# Patient Record
Sex: Male | Born: 1980 | Hispanic: No | Marital: Married | Smoking: Never smoker
Health system: Southern US, Community
[De-identification: ages and names within clinical notes are randomized; demographics above are authoritative.]

## PROBLEM LIST (undated history)

## (undated) DIAGNOSIS — Z87442 Personal history of urinary calculi: Secondary | ICD-10-CM

## (undated) DIAGNOSIS — N201 Calculus of ureter: Secondary | ICD-10-CM

## (undated) DIAGNOSIS — Z87448 Personal history of other diseases of urinary system: Secondary | ICD-10-CM

## (undated) DIAGNOSIS — I10 Essential (primary) hypertension: Secondary | ICD-10-CM

## (undated) DIAGNOSIS — N261 Atrophy of kidney (terminal): Secondary | ICD-10-CM

## (undated) DIAGNOSIS — I4510 Unspecified right bundle-branch block: Secondary | ICD-10-CM

## (undated) DIAGNOSIS — N200 Calculus of kidney: Secondary | ICD-10-CM

---

## 2010-10-08 ENCOUNTER — Emergency Department (HOSPITAL_COMMUNITY)
Admission: EM | Admit: 2010-10-08 | Discharge: 2010-10-09 | Payer: Self-pay | Source: Home / Self Care | Admitting: Emergency Medicine

## 2010-10-10 ENCOUNTER — Emergency Department (HOSPITAL_COMMUNITY)
Admission: EM | Admit: 2010-10-10 | Discharge: 2010-10-11 | Payer: Self-pay | Source: Home / Self Care | Admitting: Emergency Medicine

## 2010-10-17 ENCOUNTER — Ambulatory Visit (HOSPITAL_COMMUNITY)
Admission: RE | Admit: 2010-10-17 | Discharge: 2010-10-17 | Payer: Self-pay | Source: Home / Self Care | Attending: Urology | Admitting: Urology

## 2010-10-17 HISTORY — PX: OTHER SURGICAL HISTORY: SHX169

## 2010-10-17 LAB — BASIC METABOLIC PANEL
BUN: 11 mg/dL (ref 6–23)
CO2: 24 mEq/L (ref 19–32)
Calcium: 9 mg/dL (ref 8.4–10.5)
Chloride: 104 mEq/L (ref 96–112)
Creatinine, Ser: 1.15 mg/dL (ref 0.4–1.5)
GFR calc Af Amer: >60 mL/min (ref >60)
GFR calc non Af Amer: >60 mL/min (ref >60)
Glucose, Bld: 96 mg/dL (ref 70–99)
Potassium: 2.9 mEq/L — ABNORMAL LOW (ref 3.5–5.1)
Sodium: 137 mEq/L (ref 135–145)

## 2010-10-17 LAB — DIFFERENTIAL
Basophils Absolute: 0.1 10*3/uL (ref 0.0–0.1)
Basophils Relative: 1 % (ref 0–1)
Eosinophils Absolute: 0.5 10*3/uL (ref 0.0–0.7)
Eosinophils Relative: 4 % (ref 0–5)
Lymphocytes Relative: 28 % (ref 12–46)
Lymphs Abs: 3.3 10*3/uL (ref 0.7–4.0)
Monocytes Absolute: 0.8 10*3/uL (ref 0.1–1.0)
Monocytes Relative: 7 % (ref 3–12)
Neutro Abs: 7.2 10*3/uL (ref 1.7–7.7)
Neutrophils Relative %: 61 % (ref 43–77)

## 2010-10-17 LAB — URINALYSIS, ROUTINE W REFLEX MICROSCOPIC
Bilirubin Urine: NEGATIVE
Bilirubin Urine: NEGATIVE
Ketones, ur: NEGATIVE mg/dL
Ketones, ur: NEGATIVE mg/dL
Nitrite: NEGATIVE
Nitrite: NEGATIVE
Protein, ur: NEGATIVE mg/dL
Protein, ur: NEGATIVE mg/dL
Specific Gravity, Urine: 1.019 (ref 1.005–1.030)
Specific Gravity, Urine: 1.015 (ref 1.005–1.030)
Urine Glucose, Fasting: NEGATIVE mg/dL
Urine Glucose, Fasting: NEGATIVE mg/dL
Urobilinogen, UA: 0.2 mg/dL (ref 0.0–1.0)
Urobilinogen, UA: 0.2 mg/dL (ref 0.0–1.0)
pH: 6 (ref 5.0–8.0)
pH: 5.5 (ref 5.0–8.0)

## 2010-10-17 LAB — POCT I-STAT, CHEM 8
BUN: 22 mg/dL (ref 6–23)
Calcium, Ion: 1.04 mmol/L — ABNORMAL LOW (ref 1.12–1.32)
Chloride: 103 mEq/L (ref 96–112)
Creatinine, Ser: 1.4 mg/dL (ref 0.4–1.5)
Glucose, Bld: 108 mg/dL — ABNORMAL HIGH (ref 70–99)
HCT: 47 % (ref 39.0–52.0)
Hemoglobin: 16 g/dL (ref 13.0–17.0)
Potassium: 2.7 mEq/L — CL (ref 3.5–5.1)
Sodium: 138 mEq/L (ref 135–145)
TCO2: 28 mmol/L (ref 0–100)

## 2010-10-17 LAB — CBC
HCT: 43.8 % (ref 39.0–52.0)
Hemoglobin: 15.7 g/dL (ref 13.0–17.0)
MCH: 25.2 pg — ABNORMAL LOW (ref 26.0–34.0)
MCHC: 35.8 g/dL (ref 30.0–36.0)
MCV: 70.3 fL — ABNORMAL LOW (ref 78.0–100.0)
Platelets: 273 10*3/uL (ref 150–400)
RBC: 6.23 MIL/uL — ABNORMAL HIGH (ref 4.22–5.81)
RDW: 12.8 % (ref 11.5–15.5)
WBC: 11.9 10*3/uL — ABNORMAL HIGH (ref 4.0–10.5)

## 2010-10-17 LAB — URINE CULTURE
Colony Count: NO GROWTH
Colony Count: NO GROWTH
Culture  Setup Time: 201201081127
Culture  Setup Time: 201201092253
Culture: NO GROWTH
Culture: NO GROWTH

## 2010-10-17 LAB — URINE MICROSCOPIC-ADD ON

## 2010-10-19 LAB — BASIC METABOLIC PANEL
BUN: 15 mg/dL (ref 6–23)
CO2: 30 mEq/L (ref 19–32)
Calcium: 9.6 mg/dL (ref 8.4–10.5)
Chloride: 101 mEq/L (ref 96–112)
Creatinine, Ser: 1.41 mg/dL (ref 0.4–1.5)
GFR calc Af Amer: >60 mL/min (ref >60)
GFR calc non Af Amer: 58 mL/min — ABNORMAL LOW (ref >60)
Glucose, Bld: 97 mg/dL (ref 70–99)
Potassium: 3.9 mEq/L (ref 3.5–5.1)
Sodium: 138 mEq/L (ref 135–145)

## 2010-10-19 LAB — SURGICAL PCR SCREEN
MRSA, PCR: NEGATIVE
Staphylococcus aureus: NEGATIVE

## 2010-10-24 ENCOUNTER — Ambulatory Visit (HOSPITAL_COMMUNITY)
Admission: RE | Admit: 2010-10-24 | Discharge: 2010-10-24 | Payer: Self-pay | Source: Home / Self Care | Attending: Urology | Admitting: Urology

## 2010-10-24 NOTE — Op Note (Signed)
NAMESUHEYB, RAUCCI NO.:  [id number]  MEDICAL RECORD NO.:  [id number]          PATIENT TYPE:  AMB  LOCATION:  DAY                          FACILITY:  [facility]  PHYSICIAN:  Valetta Fuller, M.D.  DATE OF BIRTH:  1981/06/25  DATE OF PROCEDURE: DATE OF DISCHARGE:  10/17/2010                              OPERATIVE REPORT   PREOPERATIVE DIAGNOSES: 1. Left-sided flank and abdominal discomfort. 2. A 2.2-cm obstructing left ureteropelvic junction calculus. 3. Severe hydronephrosis with parenchymal thinning.  POSTOPERATIVE DIAGNOSES: 1. Left-sided flank and abdominal discomfort. 2. A 2.2-cm obstructing left ureteropelvic junction calculus. 3. Severe hydronephrosis with parenchymal thinning.  PROCEDURE PERFORMED:  Cystoscopy with left retrograde pyelogram and fluoroscopic interpretation and left double-J stent placement.  SURGEON:  Valetta Fuller, M.D.  ANESTHESIA:  General.  INDICATIONS:  Mr. Hillery is a 30 year old Asian male.  He does not speak [place].  He was in our office recently after a visit to the emergency room at [facility] with some nonspecific left-sided abdominal discomfort.  A CT scan at that time showed a nonobstructing 8-mm stone in the lower pole of his right kidney and a 2.2-cm stone in his left ureteropelvic junction.  His overall systemic renal function was normal. I carefully reviewed his images at the time of his presentation and the patient was felt to have marked left-sided hydronephrosis with fairly severe parenchymal thinning/parenchymal atrophy.  It was our feeling that this stone had certainly been obstructing on a chronic basis.  We did not have a good explanation for why his pain had suddenly intensified given what appeared to be very longstanding chronic obstruction.  We did feel that given his ongoing pain that it would be prudent to place a double-J stent to see if his pain resolved.  We then felt that his kidney function  would need to be assessed with a renal scan and if that kidney were nonfunctioning, recommend nephrectomy as the most likely treatment option.  If the stone did have salvageable function, then he would need likely an open or percutaneous approach to deal with this stone, especially given its size and density.  Through an interpreter we explained this to the family and the patient.  The patient presents now for stent placement and assessment.  TECHNIQUE AND FINDINGS:  The patient was brought to the operating room where he had successful induction of general anesthesia.  He received perioperative ciprofloxacin.  He was placed in the lithotomy position and prepped and draped in the usual manner.  Appropriate surgical time- out was performed.  Cystoscopy revealed an unremarkable anterior urethra, prostatic urethra and bladder.  Retrograde pyelogram was done with an open-ended catheter.  Fluoroscopic interpretation was performed under real time.  The patient had a very large filling defect at his ureteropelvic junction.  There was marked dilation of the renal pelvis and caliceal system.  The obstruction appeared to be high-grade.  Guidewire was placed beyond the stone with some difficulty.  We then placed a 6-French 24 cm stent which was confirmed to be in good position with fluoroscopic as well  as visual guidance.  The patient did not appear to have any complications and was brought to the recovery room in stable condition.     Valetta Fuller, M.D.     DSG/MEDQ  D:  10/18/2010  T:  10/18/2010  Job:  [id number]  Electronically Signed by Barron Alvine M.D. on 10/24/2010 09:22:27 AM

## 2010-10-27 ENCOUNTER — Other Ambulatory Visit: Payer: Self-pay | Admitting: Urology

## 2010-10-27 DIAGNOSIS — N200 Calculus of kidney: Secondary | ICD-10-CM

## 2010-11-16 ENCOUNTER — Other Ambulatory Visit: Payer: Self-pay | Admitting: Urology

## 2010-11-16 ENCOUNTER — Encounter (HOSPITAL_COMMUNITY): Payer: No Typology Code available for payment source | Attending: Urology

## 2010-11-16 DIAGNOSIS — Z01812 Encounter for preprocedural laboratory examination: Secondary | ICD-10-CM | POA: Insufficient documentation

## 2010-11-16 LAB — CBC
HCT: 45.8 % (ref 39.0–52.0)
Hemoglobin: 16.3 g/dL (ref 13.0–17.0)
MCH: 25 pg — ABNORMAL LOW (ref 26.0–34.0)
MCHC: 35.6 g/dL (ref 30.0–36.0)
MCV: 70.1 fL — ABNORMAL LOW (ref 78.0–100.0)
Platelets: 292 10*3/uL (ref 150–400)
RBC: 6.53 MIL/uL — ABNORMAL HIGH (ref 4.22–5.81)
RDW: 12.7 % (ref 11.5–15.5)
WBC: 8 10*3/uL (ref 4.0–10.5)

## 2010-11-16 LAB — SURGICAL PCR SCREEN
MRSA, PCR: NEGATIVE
Staphylococcus aureus: NEGATIVE

## 2010-11-21 ENCOUNTER — Other Ambulatory Visit: Payer: Self-pay | Admitting: Urology

## 2010-11-21 ENCOUNTER — Ambulatory Visit (HOSPITAL_COMMUNITY)
Admission: RE | Admit: 2010-11-21 | Discharge: 2010-11-21 | Disposition: A | Discharge status: Home / Self Care | Payer: No Typology Code available for payment source | Source: Ambulatory Visit | Attending: Urology | Admitting: Urology

## 2010-11-21 ENCOUNTER — Other Ambulatory Visit (HOSPITAL_COMMUNITY): Payer: Self-pay

## 2010-11-21 ENCOUNTER — Ambulatory Visit (HOSPITAL_COMMUNITY): Payer: No Typology Code available for payment source

## 2010-11-21 ENCOUNTER — Inpatient Hospital Stay (HOSPITAL_COMMUNITY)
Admission: RE | Admit: 2010-11-21 | Discharge: 2010-11-24 | DRG: 660 | Disposition: A | Discharge status: Home / Self Care | Payer: No Typology Code available for payment source | Attending: Urology | Admitting: Urology

## 2010-11-21 DIAGNOSIS — N269 Renal sclerosis, unspecified: Secondary | ICD-10-CM | POA: Diagnosis present

## 2010-11-21 DIAGNOSIS — N200 Calculus of kidney: Secondary | ICD-10-CM

## 2010-11-21 DIAGNOSIS — N1330 Unspecified hydronephrosis: Secondary | ICD-10-CM | POA: Diagnosis present

## 2010-11-21 DIAGNOSIS — Z79899 Other long term (current) drug therapy: Secondary | ICD-10-CM

## 2010-11-21 DIAGNOSIS — N201 Calculus of ureter: Principal | ICD-10-CM | POA: Diagnosis present

## 2010-11-21 HISTORY — PX: NEPHROLITHOTOMY: SUR881

## 2010-11-21 MED ORDER — IOHEXOL 300 MG/ML  SOLN: 15.0000 mL | Freq: Once | INTRAMUSCULAR | Status: AC | PRN

## 2010-11-22 ENCOUNTER — Ambulatory Visit (HOSPITAL_COMMUNITY): Payer: No Typology Code available for payment source

## 2010-11-22 LAB — CBC
HCT: 41.4 % (ref 39.0–52.0)
Hemoglobin: 14.6 g/dL (ref 13.0–17.0)
MCH: 25.1 pg — ABNORMAL LOW (ref 26.0–34.0)
MCHC: 35.3 g/dL (ref 30.0–36.0)
MCV: 71.1 fL — ABNORMAL LOW (ref 78.0–100.0)
Platelets: 285 10*3/uL (ref 150–400)
RBC: 5.82 MIL/uL — ABNORMAL HIGH (ref 4.22–5.81)
RDW: 12.8 % (ref 11.5–15.5)
WBC: 14.5 10*3/uL — ABNORMAL HIGH (ref 4.0–10.5)

## 2010-11-22 LAB — BASIC METABOLIC PANEL
CO2: 25 mEq/L (ref 19–32)
Calcium: 9 mg/dL (ref 8.4–10.5)
Chloride: 104 mEq/L (ref 96–112)
GFR calc Af Amer: >60 mL/min (ref >60)
Glucose, Bld: 132 mg/dL — ABNORMAL HIGH (ref 70–99)
Sodium: 136 mEq/L (ref 135–145)

## 2010-12-09 NOTE — Op Note (Signed)
Jeremiah, [name] NO.:  [id number]  MEDICAL RECORD NO.:  [id number]           PATIENT TYPE:  O  LOCATION:  1418                         FACILITY:  [facility]  PHYSICIAN:  Valetta Fuller, M.D.  DATE OF BIRTH:  12/07/1980  DATE OF PROCEDURE:  11/21/2010 DATE OF DISCHARGE:                              OPERATIVE REPORT   PREOPERATIVE DIAGNOSES: 1. A 3-cm left ureteropelvic junction stone. 2. Severe hydronephrosis. 3. Renal parenchymal atrophy.  POSTOPERATIVE DIAGNOSIS: 1. A 3-cm left ureteropelvic junction stone. 2. Severe hydronephrosis. 3. Renal parenchymal atrophy.  PROCEDURE PERFORMED:  Percutaneous nephrolithotomy.  SURGEON:  Valetta Fuller, M.D.  ASSISTANT:  Arn Medal, MD from Interventional Radiology.  ANESTHESIA:  General endotracheal.  INDICATIONS:  Jeremiah [name] is 30 years of age.  He had presented to see [place] because of significant left flank pain.  A CT scan performed in the emergency room revealed approximately a 3-cm stone at his ureteropelvic junction with very high-grade obstruction and considerable parenchymal atrophy suggesting that this had been a longstanding process.  His renal scan, however, did show a nonfunctioning left kidney to justify preservation of that kidney.  A small double-J stent was placed prior to his renal scan.  We discussed the situation with the patient by an interpreter along with family members.  We felt that given his young age and health that salvage of that kidney was important.  Given the size of the stone and its density, the only reasonable option was percutaneous nephrolithotomy.  The patient had placement of nephrostomy tube without difficulty this morning and Interventional Radiology and presents now for the procedure.  He has received perioperative antibiotics and placement of compression boots.  TECHNIQUE AND FINDINGS:  The patient was brought to the operating room. He had successful induction of general  endotracheal anesthesia.  He was then placed in the prone position.  All extremities carefully padded. He was prepped and draped in the usual manner.  Interventional Radiology then placed a wire through the nephrostomy tube.  A small stab incision was made at the site of the nephrostomy tube and a peel-away sheath was placed to allow placement of a second safety wire.  The fascia was then dilated and access sheath placed.  The stone was easily encountered, impacted in the ureteropelvic junction with good visualization and excellent nephrostomy tube placement. Initial attempts to fracture the stone with a lithoclast were unsuccessful and it was quite obvious that this was a very dense stone, probably consisting primarily of calcium oxalate monohydrate.  For that reason, we then utilized a holmium laser lithotriptor to fracture the stone.  This was a long arduous processed taking approximately an hour and half of laser time.  Large fragments were extracted with grasper and on completion of the procedure there were no obvious residual fragments.A new stent was placed over one of the safety wires down to the bladder and a red Robinson catheter was placed as a nephrostomy tube.  There was no evidence of any gross extravasation on nephrostogram and no evidence of any renal collecting system perforation.  The patient remained stable, had no obvious complications and was brought to recovery room in stable condition.     Valetta Fuller, M.D.     DSG/MEDQ  D:  11/22/2010  T:  11/22/2010  Job:  [id number]  Electronically Signed by Barron Alvine M.D. on 12/09/2010 01:46:28 PM

## 2010-12-20 ENCOUNTER — Other Ambulatory Visit (HOSPITAL_COMMUNITY): Payer: Self-pay | Admitting: Radiology

## 2012-08-02 ENCOUNTER — Encounter (HOSPITAL_COMMUNITY): Payer: Self-pay | Admitting: Emergency Medicine

## 2012-08-02 ENCOUNTER — Emergency Department (HOSPITAL_COMMUNITY)
Admission: EM | Admit: 2012-08-02 | Discharge: 2012-08-02 | Disposition: A | Discharge status: Home / Self Care | Payer: No Typology Code available for payment source | Source: Home / Self Care | Attending: Emergency Medicine | Admitting: Emergency Medicine

## 2012-08-02 DIAGNOSIS — R030 Elevated blood-pressure reading, without diagnosis of hypertension: Secondary | ICD-10-CM

## 2012-08-02 DIAGNOSIS — R51 Headache: Secondary | ICD-10-CM

## 2012-08-02 MED ORDER — HYDROCHLOROTHIAZIDE 25 MG PO TABS: 12.5000 mg | ORAL_TABLET | Freq: Every day | ORAL | Status: DC

## 2012-08-02 NOTE — ED Provider Notes (Signed)
History     CSN: [id number]  Arrival date & time 08/02/12  1656   First MD Initiated Contact with Patient 08/02/12 1658      Chief Complaint  Patient presents with  . Hypertension  . Headache    (Consider location/radiation/quality/duration/timing/severity/associated sxs/prior treatment) HPI Comments: Patient presents urgent care this evening complaining of an ongoing headache that comes and goes for several months. He describes that he suspects his blood pressure as high as he has taken his blood pressure at other locations thinks that at a pharmacy where his readings are always elevated. He denies any shortness of breath, lower extremity swelling or shortness of breath. At this point patient denies any severe headache, numbness tingling sensation or weakness of his upper or lower extremities.  Patient is a 31 y.o. male presenting with hypertension and headaches. The history is provided by the patient.  Hypertension The problem occurs every several days. The problem has not changed since onset.Associated symptoms include headaches. Pertinent negatives include no chest pain, no abdominal pain and no shortness of breath. Nothing relieves the symptoms.  Headache The primary symptoms include headaches. Primary symptoms do not include seizures, dizziness or fever.  The headache is not associated with neck stiffness or weakness.  Additional symptoms do not include neck stiffness or weakness. Medical issues also include hypertension.    History reviewed. No pertinent past medical history.  No past surgical history on file.  No family history on file.  History  Substance Use Topics  . Smoking status: Not on file  . Smokeless tobacco: Not on file  . Alcohol Use: Not on file      Review of Systems  Constitutional: Negative for fever, chills, activity change, appetite change and fatigue.  HENT: Negative for neck stiffness.   Eyes: Negative for visual disturbance.  Respiratory:  Negative for cough and shortness of breath.   Cardiovascular: Negative for chest pain.  Gastrointestinal: Negative for abdominal pain.  Skin: Negative for rash and wound.  Neurological: Positive for headaches. Negative for dizziness, tremors, seizures, syncope, facial asymmetry, weakness and numbness.    Allergies  Review of patient's allergies indicates no known allergies.  Home Medications   Current Outpatient Rx  Name Route Sig Dispense Refill  . HYDROCHLOROTHIAZIDE 25 MG PO TABS Oral Take 0.5 tablets (12.5 mg total) by mouth daily. 30 tablet 1    BP 165/98  Pulse 76  Temp 98.4 F (36.9 C) (Oral)  Resp 16  Physical Exam  Nursing note and vitals reviewed. Constitutional: He is oriented to person, place, and time. Vital signs are normal. He appears well-developed and well-nourished.  Non-toxic appearance. He does not have a sickly appearance. He does not appear ill. No distress.  HENT:  Head: Normocephalic.  Neck: No JVD present.  Cardiovascular: Normal rate, regular rhythm, normal heart sounds and normal pulses.  Exam reveals no gallop and no friction rub.   No murmur heard. Pulmonary/Chest: Effort normal and breath sounds normal. He has no decreased breath sounds. He has no wheezes. He has no rhonchi. He has no rales.  Abdominal: Soft.  Musculoskeletal: Normal range of motion.  Neurological: He is alert and oriented to person, place, and time. No cranial nerve deficit. He exhibits normal muscle tone. Coordination normal.  Skin: Skin is warm.    ED Course  Procedures (including critical care time)  Labs Reviewed - No data to display No results found.   1. Elevated blood pressure   2. Headache  MDM  Patient is reporting previous elevated blood pressures. Have decided to start patient hydrochlorothiazide a minimal does have encouraged him to followup with her primary care doctor in order for him to 4 weeks. Patient understands        Jimmie Molly,  MD 08/02/12 1859

## 2012-08-02 NOTE — ED Notes (Signed)
Reports headache for months now.  Patient reports hight blood pressure for months now.  Reports that he doesn't have primary provider

## 2012-11-18 ENCOUNTER — Emergency Department (INDEPENDENT_AMBULATORY_CARE_PROVIDER_SITE_OTHER): Payer: No Typology Code available for payment source

## 2012-11-18 ENCOUNTER — Emergency Department (INDEPENDENT_AMBULATORY_CARE_PROVIDER_SITE_OTHER)
Admission: EM | Admit: 2012-11-18 | Discharge: 2012-11-18 | Disposition: A | Discharge status: Home / Self Care | Payer: No Typology Code available for payment source | Source: Home / Self Care | Attending: Emergency Medicine | Admitting: Emergency Medicine

## 2012-11-18 ENCOUNTER — Encounter (HOSPITAL_COMMUNITY): Payer: Self-pay | Admitting: Emergency Medicine

## 2012-11-18 DIAGNOSIS — N200 Calculus of kidney: Secondary | ICD-10-CM

## 2012-11-18 HISTORY — DX: Essential (primary) hypertension: I10

## 2012-11-18 LAB — POCT URINALYSIS DIP (DEVICE)
Bilirubin Urine: NEGATIVE
Ketones, ur: NEGATIVE mg/dL
Leukocytes, UA: NEGATIVE
Protein, ur: NEGATIVE mg/dL
pH: 5.5 (ref 5.0–8.0)

## 2012-11-18 MED ORDER — ONDANSETRON HCL 4 MG/2ML IJ SOLN
4.0000 mg | Freq: Once | INTRAMUSCULAR | Status: AC
  Administered 2012-11-18: 4 mg via INTRAVENOUS

## 2012-11-18 MED ORDER — HYDROMORPHONE HCL PF 1 MG/ML IJ SOLN
2.0000 mg | Freq: Once | INTRAMUSCULAR | Status: AC
  Administered 2012-11-18: 2 mg via INTRAVENOUS

## 2012-11-18 MED ORDER — SODIUM CHLORIDE 0.9 % IV SOLN
INTRAVENOUS | Status: DC
  Administered 2012-11-18: 14:00:00 via INTRAVENOUS

## 2012-11-18 MED ORDER — ONDANSETRON 8 MG PO TBDP: 8.0000 mg | ORAL_TABLET | Freq: Three times a day (TID) | ORAL | Status: DC | PRN

## 2012-11-18 MED ORDER — HYDROCHLOROTHIAZIDE 25 MG PO TABS: 25.0000 mg | ORAL_TABLET | Freq: Every day | ORAL | Status: DC

## 2012-11-18 MED ORDER — HYDROMORPHONE HCL PF 1 MG/ML IJ SOLN
INTRAMUSCULAR | Status: AC
  Filled 2012-11-18: qty 2

## 2012-11-18 MED ORDER — TAMSULOSIN HCL 0.4 MG PO CAPS: 0.4000 mg | ORAL_CAPSULE | Freq: Every day | ORAL | Status: DC

## 2012-11-18 MED ORDER — ONDANSETRON HCL 4 MG/2ML IJ SOLN
INTRAMUSCULAR | Status: AC
  Filled 2012-11-18: qty 2

## 2012-11-18 MED ORDER — OXYCODONE-ACETAMINOPHEN 5-325 MG PO TABS
ORAL_TABLET | ORAL | Status: DC
Start: 2012-11-18 — End: 2014-03-13

## 2012-11-18 MED ORDER — KETOROLAC TROMETHAMINE 30 MG/ML IJ SOLN
INTRAMUSCULAR | Status: AC
  Filled 2012-11-18: qty 1

## 2012-11-18 MED ORDER — KETOROLAC TROMETHAMINE 30 MG/ML IJ SOLN
30.0000 mg | Freq: Once | INTRAMUSCULAR | Status: AC
  Administered 2012-11-18: 30 mg via INTRAVENOUS

## 2012-11-18 MED ORDER — HYDROMORPHONE HCL PF 1 MG/ML IJ SOLN: 2.0000 mg | Freq: Once | INTRAMUSCULAR | Status: DC

## 2012-11-18 NOTE — ED Notes (Signed)
Pt  Reports  Pain is  Better   Family  At  Bedside

## 2012-11-18 NOTE — ED Provider Notes (Signed)
Chief Complaint  Patient presents with  . Flank Pain    History of Present Illness:   Jeremiah [name] is a 32 year old male who has had a 3 to four-week history of right flank pain has gotten worse in the past 24 hours. He rates the pain a 10 over 10 in intensity. He denies any fever, chills, nausea, or vomiting. He's had no urinary symptoms including dysuria, frequency, urgency, or hematuria. He denies any constipation, diarrhea, blood in stool. He had a kidney stone several years ago and was seen by [facility] for that.  Review of Systems:  Other than noted above, the patient denies any of the following symptoms: General:  No fevers, chills, sweats, aches, or fatigue. GI:  No abdominal pain, back pain, nausea, vomiting, diarrhea, or constipation. GU:  No dysuria, frequency, urgency, hematuria, urethral discharge, penile lesions, penile pain, testicular pain, swelling, or mass, inguinal lymphadenopathy or incontinence.  PMFSH:  Past medical history, family history, social history, meds, and allergies were reviewed.  Physical Exam:   Vital signs:  BP 150/82  Pulse 72  Temp(Src) 98.6 F (37 C) (Oral)  SpO2 100% Gen:  Alert, oriented, in moderate distress due to pain from kidney stone. Lungs:  Clear to auscultation, no wheezes, rales or rhonchi. Heart:  Regular rhythm, no gallop or murmer. Abdomen:  Flat and soft.  No tenderness to palpation, guarding, or rebound.  No hepato-splenomegaly or mass.  Bowel sounds were normally active.  No hernia. Back:  No CVA tenderness.  Skin:  Clear, warm and dry.  Labs:   Results for orders placed during the hospital encounter of 11/18/12  POCT URINALYSIS DIP (DEVICE)      Result Value Range   Glucose, UA NEGATIVE  NEGATIVE mg/dL   Bilirubin Urine NEGATIVE  NEGATIVE   Ketones, ur NEGATIVE  NEGATIVE mg/dL   Specific Gravity, Urine <=1.005  1.005 - 1.030   Hgb urine dipstick SMALL (*) NEGATIVE   pH 5.5  5.0 - 8.0   Protein, ur NEGATIVE  NEGATIVE mg/dL    Urobilinogen, UA 0.2  0.0 - 1.0 mg/dL   Nitrite NEGATIVE  NEGATIVE   Leukocytes, UA NEGATIVE  NEGATIVE   Dg Abd 1 View  11/18/2012  *RADIOLOGY REPORT*  Clinical Data: Right-sided flank pain for past 2 weeks.  ABDOMEN - 1 VIEW  Comparison: Abdominal radiograph 02/28/2011.  Findings: Again noted is a large 9 mm calcification projecting over the lower pole collecting system of the right kidney.  A rim calcified structure projects over the expected location of the lower pole collecting system of the left kidney, however it is uncertain whether not this represents a stone, or something in the fecal stream.  No additional calcifications are confidently identified along the course of either ureter.  A small left sided pelvic phlebolith is incidentally noted. Visualized bowel gas pattern is nonobstructive.  IMPRESSION: 1.  9 mm calculus in the lower pole collecting system of the right kidney. 2.  6 mm rim calcified structure projecting over the lower pole of the left kidney may represent a new nonobstructive calculus, or may be within the fecal stream.   Original Report Authenticated By: Trudie Reed, M.D.    Course in Urgent Care Center:   He was given IV normal saline, 1 L, Toradol 30 mg IV, Dilaudid 2 mg IV, and Zofran 4 mg IV. He was a little bit drowsy after these meds, his pain was somewhat better, down to 7/10 but not gone away completely.  Assessment: The encounter diagnosis was Kidney stone.   Plan:   1.  The following meds were prescribed:   Discharge Medication List as of 11/18/2012  3:37 PM    START taking these medications   Details  !! hydrochlorothiazide (HYDRODIURIL) 25 MG tablet Take 1 tablet (25 mg total) by mouth daily., Starting 11/18/2012, Until Discontinued, Normal    ondansetron (ZOFRAN ODT) 8 MG disintegrating tablet Take 1 tablet (8 mg total) by mouth every 8 (eight) hours as needed for nausea., Starting 11/18/2012, Until Discontinued, Normal    oxyCODONE-acetaminophen  (PERCOCET) 5-325 MG per tablet 1 to 2 tablets every 6 hours as needed for pain., Print    Tamsulosin HCl (FLOMAX) 0.4 MG CAPS Take 1 capsule (0.4 mg total) by mouth daily., Starting 11/18/2012, Until Discontinued, Normal     !! - Potential duplicate medications found. Please discuss with provider.     2.  The patient was instructed in symptomatic care and handouts were given. 3.  The patient was told to return if becoming worse in any way, if no better in 3 or 4 days, and given some red flag symptoms that would indicate earlier return.  Follow up:  The patient was told to follow up with [facility] tomorrow at 2:45 PM. She was given a refill for his hydrochlorothiazide which he had been taking but got off of. I told he would need to followup with a primary care physician for his high blood pressure.      Reuben Likes, MD 11/18/12 2052

## 2012-11-18 NOTE — ED Notes (Signed)
Pt  Reports    r  Flank  Pain           With  Nausea     No   Vomiting          Symptoms  Started   About  2  Weeks     Ago           Got  Worse    Today

## 2012-11-18 NOTE — ED Notes (Signed)
Plan of  Care  Discussed  With  Patient     -         This  Writer  Unable  To  Obtain  An appt  For  This  Patient     The   On call  Urologist  Office  Stated  They  Did  Not  Accept  His  Insurance  And  They suggested the  Patient    Contact  Another  Physician  That took  His  Insurance         As of note  The  Patient had  Been  Seen by this  Office  In the  Past

## 2012-11-18 NOTE — ED Notes (Signed)
Dr  Cinda Quest  And  Spoke  To  The  On  Duty  Urologist who  tyhen  Arranged  An  appt  For  Pt  tommorow at  245  Pm

## 2013-06-26 ENCOUNTER — Emergency Department (INDEPENDENT_AMBULATORY_CARE_PROVIDER_SITE_OTHER)
Admission: EM | Admit: 2013-06-26 | Discharge: 2013-06-26 | Disposition: A | Discharge status: Home / Self Care | Payer: No Typology Code available for payment source | Source: Home / Self Care | Attending: Family Medicine | Admitting: Family Medicine

## 2013-06-26 ENCOUNTER — Encounter (HOSPITAL_COMMUNITY): Payer: Self-pay | Admitting: Emergency Medicine

## 2013-06-26 DIAGNOSIS — I10 Essential (primary) hypertension: Secondary | ICD-10-CM

## 2013-06-26 DIAGNOSIS — R51 Headache: Secondary | ICD-10-CM

## 2013-06-26 LAB — POCT I-STAT, CHEM 8
BUN: 17 mg/dL (ref 6–23)
Calcium, Ion: 1.21 mmol/L (ref 1.12–1.23)
Chloride: 100 mEq/L (ref 96–112)
Creatinine, Ser: 1.1 mg/dL (ref 0.50–1.35)
Glucose, Bld: 91 mg/dL (ref 70–99)
TCO2: 29 mmol/L (ref 0–100)

## 2013-06-26 MED ORDER — HYDROCHLOROTHIAZIDE 25 MG PO TABS: 25.0000 mg | ORAL_TABLET | Freq: Every day | ORAL | Status: DC

## 2013-06-26 NOTE — ED Notes (Signed)
HEADACHE, PATIENT CONCERNED HEADACHE RELATED TO BLOOD PRESSURE.

## 2013-06-26 NOTE — ED Provider Notes (Signed)
CSN: [id number]     Arrival date & time 06/26/13  1706 History   First MD Initiated Contact with Patient 06/26/13 1805     Chief Complaint  Patient presents with  . Headache   (Consider location/radiation/quality/duration/timing/severity/associated sxs/prior Treatment) HPI Comments: HPI obtained via interpreter.  83m presents c/o headache.  The HA is bilateral frontal and occipital, constant/throbbing.  He has a Hx of headaches being caused by HTN.  He was previously treated with 25 mg HCTZ daily with did control the HA.  This HA feels exactly the same as his previous HA that were caused by HTN.  After he was seen here before, he never followed up with PCP as he was directed to do.  Denies NVD, blurry vision, or any other systemic SXS.    Patient is a 32 y.o. male presenting with headaches.  Headache Associated symptoms: no abdominal pain, no cough, no diarrhea, no dizziness, no fatigue, no fever, no myalgias, no nausea, no neck pain, no neck stiffness, no sore throat and no vomiting     Past Medical History  Diagnosis Date  . Hypertension    History reviewed. No pertinent past surgical history. No family history on file. History  Substance Use Topics  . Smoking status: Never Smoker   . Smokeless tobacco: Not on file  . Alcohol Use: No    Review of Systems  Constitutional: Negative for fever, chills and fatigue.  HENT: Negative for sore throat, neck pain and neck stiffness.   Eyes: Negative for visual disturbance.  Respiratory: Negative for cough and shortness of breath.   Cardiovascular: Negative for chest pain, palpitations and leg swelling.  Gastrointestinal: Negative for nausea, vomiting, abdominal pain, diarrhea and constipation.  Genitourinary: Negative for dysuria, urgency, frequency and hematuria.  Musculoskeletal: Negative for myalgias and arthralgias.  Skin: Negative for rash.  Neurological: Positive for headaches. Negative for dizziness, weakness and light-headedness.     Allergies  Review of patient's allergies indicates no known allergies.  Home Medications   Current Outpatient Rx  Name  Route  Sig  Dispense  Refill  . acetaminophen (TYLENOL) 325 MG tablet   Oral   Take 650 mg by mouth every 6 (six) hours as needed for pain.         . hydrochlorothiazide (HYDRODIURIL) 25 MG tablet   Oral   Take 0.5 tablets (12.5 mg total) by mouth daily.   30 tablet   1   . hydrochlorothiazide (HYDRODIURIL) 25 MG tablet   Oral   Take 1 tablet (25 mg total) by mouth daily.   90 tablet   1   . hydrochlorothiazide (HYDRODIURIL) 25 MG tablet   Oral   Take 1 tablet (25 mg total) by mouth daily.   90 tablet   0   . ondansetron (ZOFRAN ODT) 8 MG disintegrating tablet   Oral   Take 1 tablet (8 mg total) by mouth every 8 (eight) hours as needed for nausea.   20 tablet   0   . oxyCODONE-acetaminophen (PERCOCET) 5-325 MG per tablet      1 to 2 tablets every 6 hours as needed for pain.   20 tablet   0   . Tamsulosin HCl (FLOMAX) 0.4 MG CAPS   Oral   Take 1 capsule (0.4 mg total) by mouth daily.   7 capsule   0    BP 139/91  Pulse 72  Temp(Src) 98.3 F (36.8 C) (Oral)  Resp 16  SpO2 100% Physical Exam  Nursing note and vitals reviewed. Constitutional: He is oriented to person, place, and time. He appears well-developed and well-nourished. No distress.  HENT:  Head: Normocephalic.  Eyes: Conjunctivae and EOM are normal. Pupils are equal, round, and reactive to light. No scleral icterus.  Neck: Normal range of motion. Neck supple. No JVD present.  Pulmonary/Chest: Effort normal. No respiratory distress.  Lymphadenopathy:    He has no cervical adenopathy.  Neurological: He is alert and oriented to person, place, and time. No cranial nerve deficit. He exhibits normal muscle tone. Coordination normal.  Skin: Skin is warm and dry. No rash noted. He is not diaphoretic.  Psychiatric: He has a normal mood and affect. Judgment normal.    ED  Course  Procedures (including critical care time) Labs Review Labs Reviewed  POCT I-STAT, CHEM 8 - Abnormal; Notable for the following:    Hemoglobin 18.4 (*)    HCT 54.0 (*)    All other components within normal limits   Imaging Review No results found.  MDM   1. Headache   2. Hypertension    istat normal.  Restarting HCTZ.  F/u with PCP    Meds ordered this encounter  Medications  . hydrochlorothiazide (HYDRODIURIL) 25 MG tablet    Sig: Take 1 tablet (25 mg total) by mouth daily.    Dispense:  90 tablet    Refill:  0       Graylon Good, PA-C 06/26/13 1934

## 2013-06-27 NOTE — ED Provider Notes (Signed)
Medical screening examination/treatment/procedure(s) were performed by resident physician or non-physician practitioner and as supervising physician I was immediately available for consultation/collaboration.   Marjani Kobel DOUGLAS MD.   Delynn Olvera D Giani Winther, MD 06/27/13 0936 

## 2013-12-31 DIAGNOSIS — Z87442 Personal history of urinary calculi: Secondary | ICD-10-CM

## 2013-12-31 HISTORY — DX: Personal history of urinary calculi: Z87.442

## 2014-01-15 ENCOUNTER — Other Ambulatory Visit: Payer: Self-pay | Admitting: Urology

## 2014-01-26 NOTE — Progress Notes (Signed)
Attempted to call pt at home number listed.  No answer and no voicemail set up to leave message.

## 2014-01-28 NOTE — Progress Notes (Addendum)
Unable to contact patient with phone number provided. Interpretor is unable to contact patient or emergency contact for patient. Message left with Jeremiah DecampSelita [name], scheduler, asking for alternative number to reach patient. Writer indicated that this is first case on 02/02/14 and if patient could not be reached prior to procedure, very difficult to get patient ready by 0730.

## 2014-01-29 ENCOUNTER — Encounter (HOSPITAL_COMMUNITY): Payer: Self-pay | Admitting: *Deleted

## 2014-01-30 NOTE — H&P (Signed)
Reason For Visit    [name] presents today for follow up of right flank pain.Still with some on/ off pain.   History of Present Illness          [name] has a fairly complex urologic history. 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 Tests showed that his kidney function was at about 39% so the kidney was salvaged and he underwent a percutaneous nephrolithotomy of a 3 cm stone impacted in his ureteropelvic junction in February 2012.  The surgery went quite well but the stone was extremely hard and turned out to be primarily calcium oxalate monohydrate. A CT scan status post the procedure showed no evidence of any gross extravasation, significant hematoma, or any significant residual stone fragments. [name] did have an 8 mm stone in the lower pole of the right kidney that needed to be carefully monitored. Due to insurance issues he did not follow up. About a year ago he was seen with right flank pain. A stable right renal stone was seen as well as a possible LLP stone. He was asked to go to his PCP for possible back pain as the stones did not appear obstructing. We did ask him to return here or another urologist to keep an eye on his stones. He did not go to his PCP nor has he followed up anywhere in regards to his stones.     Interval history: [name] presented 12-23-13 with right flank. This was intermittent and has been ongoing since he was last seen. It is not positional. Nothing makes this better or worse. Sometimes it is associated with nausea. He denies any LUTS, dysuria, or hematuria. He has not followed up with anyone reportedly due to insurance issues. He now has "better" insurance. He has not had a fever.    He is with two male family friends today who have served as an Equities traderinterpreter.   Surgical History Problems  1. History of Cystoscopy With Insertion Of Ureteral Stent Left 2. History of Percutaneous Lithotomy For Stone Over 2cm. 3.  History of Renal Endoscopy Via Nephrostomy With Ureteral Catheterizati  Current Meds 1. Hydrocodone-Acetaminophen 5-325 MG Oral Tablet; TAKE 1 TABLET EVERY 4 TO 6  HOURS AS NEEDED;  Therapy: 24Mar2015 to (Last Rx:24Mar2015) Ordered  Allergies Medication  1. No Known Drug Allergies  Family History Problems  1. Family history of Family Health Status Number Of Children   2 sons and 2 girls  Social History Problems  1. Denied: History of Alcohol Use 2. Denied: History of Caffeine Use 3. Marital History - Currently Married 4. Never A Smoker 5. Denied: History of Tobacco Use  Review of Systems Genitourinary, constitutional, skin, eye, otolaryngeal, hematologic/lymphatic, cardiovascular, pulmonary, endocrine, musculoskeletal, gastrointestinal, neurological and psychiatric system(s) were reviewed and pertinent findings if present are noted.  Genitourinary: hematuria.  Gastrointestinal: flank pain.  Musculoskeletal: back pain.    Vitals Vital Signs [Data Includes: Last 1 Day]  Recorded: 13Apr2015 01:34PM  Blood Pressure: 156 / 101 Temperature: 98.5 F Heart Rate: 80  Physical Exam Constitutional: Well nourished and well developed . No acute distress.  Neck: The appearance of the neck is normal and no neck mass is present.  Pulmonary: No respiratory distress and normal respiratory rhythm and effort.  Cardiovascular: Heart rate and rhythm are normal . No peripheral edema.  Abdomen: The abdomen is soft and nontender. No masses are palpated. No CVA tenderness. No hernias are palpable.  No hepatosplenomegaly noted.  Skin: Normal skin turgor, no visible rash and no visible skin lesions.  Neuro/Psych:. Mood and affect are appropriate.    Results/Data Urine [Data Includes: Last 1 Day]   13Apr2015  COLOR YELLOW   APPEARANCE CLEAR   SPECIFIC GRAVITY 1.025   pH 6.0   GLUCOSE NEG mg/dL  BILIRUBIN NEG   KETONE NEG mg/dL  BLOOD TRACE   PROTEIN NEG mg/dL  UROBILINOGEN 0.2 mg/dL   NITRITE NEG   LEUKOCYTE ESTERASE NEG   SQUAMOUS EPITHELIAL/HPF RARE   WBC NONE SEEN WBC/hpf  RBC 3-6 RBC/hpf  BACTERIA NONE SEEN   CRYSTALS NONE SEEN   CASTS NONE SEEN    Assessment Assessed  1. Right flank pain (789.09) 2. Bilateral kidney stones (592.0)  Plan Health Maintenance  1. UA With REFLEX; [Do Not Release]; Status:Resulted - Requires Verification;   Done:  13Apr2015 01:09PM  Discussion/Summary   Mr Jeremiah [name] has had issues with nephrolithiasis. More recently he has had intermittent lower right-sided back and flank discomfort. There were aspects of his pain that seemed to be somewhat positional, making stone disease less likely. On the other hand he tells me at times when the pain is worse he seems to notice some hematuria. He does have a 9 mm stone in the lower pole of his right kidney. It has increased somewhat in size and certainly it is reasonable to treat this given his age and the circumstances. The stone does not appear to be markedly dense and has a reasonable chance of being able to be treated successfully with ESWL. The 2 things that I wanted him to understand the most today would be that there is no guarantee that this will resolve his pain and if anything, there is a higher chance that it will not. I do think his pain may very well be more musculoskeletal, but if we do get rid of the stone and he continues to hurt, then that certainly would open up additional assessment opportunities. It is also possible that this stone will not be completely treated with ESWL and may require secondary procedures. We are going to go ahead and schedule him for sometime in the next several weeks with followup a few weeks later to check on his status.   Signatures Electronically signed by : Barron Alvineavid Leolia Vinzant, M.D.; Jan 12 2014  3:56PM EST

## 2014-02-02 ENCOUNTER — Ambulatory Visit (HOSPITAL_COMMUNITY): Payer: 59

## 2014-02-02 ENCOUNTER — Ambulatory Visit (HOSPITAL_COMMUNITY)
Admission: RE | Admit: 2014-02-02 | Discharge: 2014-02-02 | Disposition: A | Discharge status: Home / Self Care | Payer: 59 | Source: Ambulatory Visit | Attending: Urology | Admitting: Urology

## 2014-02-02 ENCOUNTER — Encounter (HOSPITAL_COMMUNITY): Payer: Self-pay | Admitting: *Deleted

## 2014-02-02 ENCOUNTER — Ambulatory Visit (HOSPITAL_COMMUNITY): Payer: 59 | Admitting: Certified Registered Nurse Anesthetist

## 2014-02-02 ENCOUNTER — Encounter (HOSPITAL_COMMUNITY)
Admission: RE | Disposition: A | Discharge status: Home / Self Care | Payer: Self-pay | Source: Ambulatory Visit | Attending: Urology

## 2014-02-02 ENCOUNTER — Encounter (HOSPITAL_COMMUNITY): Payer: 59 | Admitting: Certified Registered Nurse Anesthetist

## 2014-02-02 DIAGNOSIS — N201 Calculus of ureter: Secondary | ICD-10-CM | POA: Insufficient documentation

## 2014-02-02 HISTORY — PX: EXTRACORPOREAL SHOCK WAVE LITHOTRIPSY: SHX1557

## 2014-02-02 HISTORY — DX: Personal history of urinary calculi: Z87.442

## 2014-02-02 SURGERY — LITHOTRIPSY, ESWL
Anesthesia: Monitor Anesthesia Care | Laterality: Right

## 2014-02-02 MED ORDER — DEXTROSE-NACL 5-0.45 % IV SOLN
INTRAVENOUS | Status: DC
  Administered 2014-02-02 (×2): via INTRAVENOUS

## 2014-02-02 MED ORDER — ONDANSETRON HCL 4 MG/2ML IJ SOLN
INTRAMUSCULAR | Status: AC
  Filled 2014-02-02: qty 2

## 2014-02-02 MED ORDER — CIPROFLOXACIN HCL 500 MG PO TABS
500.0000 mg | ORAL_TABLET | ORAL | Status: AC
  Administered 2014-02-02: 500 mg via ORAL
  Filled 2014-02-02: qty 1

## 2014-02-02 MED ORDER — ONDANSETRON HCL 4 MG/2ML IJ SOLN
INTRAMUSCULAR | Status: DC | PRN
  Administered 2014-02-02: 4 mg via INTRAVENOUS

## 2014-02-02 MED ORDER — LIDOCAINE HCL (CARDIAC) 20 MG/ML IV SOLN
INTRAVENOUS | Status: DC | PRN
  Administered 2014-02-02: 100 mg via INTRAVENOUS

## 2014-02-02 MED ORDER — DIPHENHYDRAMINE HCL 25 MG PO CAPS
25.0000 mg | ORAL_CAPSULE | ORAL | Status: AC
  Administered 2014-02-02: 25 mg via ORAL
  Filled 2014-02-02: qty 1

## 2014-02-02 MED ORDER — LIDOCAINE HCL (CARDIAC) 20 MG/ML IV SOLN
INTRAVENOUS | Status: AC
  Filled 2014-02-02: qty 5

## 2014-02-02 MED ORDER — DIAZEPAM 5 MG PO TABS
10.0000 mg | ORAL_TABLET | ORAL | Status: AC
  Administered 2014-02-02: 10 mg via ORAL
  Filled 2014-02-02: qty 2

## 2014-02-02 MED ORDER — MIDAZOLAM HCL 5 MG/5ML IJ SOLN
INTRAMUSCULAR | Status: DC | PRN
Start: 2014-02-02 — End: 2014-02-02
  Administered 2014-02-02 (×2): 1 mg via INTRAVENOUS

## 2014-02-02 MED ORDER — PROPOFOL INFUSION 10 MG/ML OPTIME
INTRAVENOUS | Status: DC | PRN
Start: 2014-02-02 — End: 2014-02-02
  Administered 2014-02-02: 75 ug/kg/min via INTRAVENOUS

## 2014-02-02 MED ORDER — FENTANYL CITRATE 0.05 MG/ML IJ SOLN
INTRAMUSCULAR | Status: DC | PRN
  Administered 2014-02-02: 100 ug via INTRAVENOUS
  Administered 2014-02-02 (×3): 50 ug via INTRAVENOUS

## 2014-02-02 NOTE — Transfer of Care (Signed)
Immediate Anesthesia Transfer of Care Note  Patient: Jeremiah [name] HeightKun Coppa  Procedure(s) Performed: Procedure(s): RIGHT EXTRACORPOREAL SHOCK WAVE LITHOTRIPSY (ESWL) (Right)  Patient Location: PACU and Short Stay  Anesthesia Type:MAC  Level of Consciousness: awake, alert  and oriented  Airway & Oxygen Therapy: Patient Spontanous Breathing  Post-op Assessment: Report given to PACU RN and Post -op Vital signs reviewed and stable  Post vital signs: Reviewed and stable  Complications: No apparent anesthesia complications

## 2014-02-02 NOTE — Op Note (Signed)
See [facility] OP note scanned into chart. 

## 2014-02-02 NOTE — Anesthesia Preprocedure Evaluation (Signed)
Anesthesia Evaluation  Patient identified by MRN, date of birth, ID band Patient awake    Reviewed: Allergy & Precautions, H&P , NPO status , Patient's Chart, lab work & pertinent test results  Airway Mallampati: I TM Distance: >3 FB Neck ROM: Full    Dental  (+) Dental Advisory Given   Pulmonary neg pulmonary ROS,  breath sounds clear to auscultation        Cardiovascular hypertension, Pt. on medications Rhythm:Regular Rate:Normal     Neuro/Psych negative neurological ROS  negative psych ROS   GI/Hepatic negative GI ROS, Neg liver ROS,   Endo/Other  negative endocrine ROS  Renal/GU negative Renal ROS     Musculoskeletal negative musculoskeletal ROS (+)   Abdominal   Peds  Hematology negative hematology ROS (+)   Anesthesia Other Findings   Reproductive/Obstetrics negative OB ROS                           Anesthesia Physical Anesthesia Plan  ASA: II  Anesthesia Plan: MAC   Post-op Pain Management:    Induction: Intravenous  Airway Management Planned:   Additional Equipment:   Intra-op Plan:   Post-operative Plan:   Informed Consent: I have reviewed the patients History and Physical, chart, labs and discussed the procedure including the risks, benefits and alternatives for the proposed anesthesia with the patient or authorized representative who has indicated his/her understanding and acceptance.   Dental advisory given  Plan Discussed with: CRNA  Anesthesia Plan Comments:         Anesthesia Quick Evaluation

## 2014-02-02 NOTE — Anesthesia Postprocedure Evaluation (Signed)
Anesthesia Post Note  Patient: Jeremiah [name]  Procedure(s) Performed: Procedure(s) (LRB): RIGHT EXTRACORPOREAL SHOCK WAVE LITHOTRIPSY (ESWL) (Right)  Anesthesia type: MAC  Patient location: PACU  Post pain: Pain level controlled  Post assessment: Post-op Vital signs reviewed  Last Vitals: BP 152/98  Pulse 82  Temp(Src) 36.1 C (Oral)  Resp 16  Ht 5\' 3"  (1.6 m)  Wt 156 lb 6 oz (70.931 kg)  BMI 27.71 kg/m2  SpO2 99%  Post vital signs: Reviewed  Level of consciousness: awake  Complications: No apparent anesthesia complications

## 2014-02-02 NOTE — Discharge Instructions (Signed)
See [facility] discharge instructions in chart.  

## 2014-02-02 NOTE — Progress Notes (Signed)
Returned from lithotripsy truck via w/c accompanied by Animal nutritioniststaff and CRNA. Pt had MAC on truck. Pt is very sedated . Will awaken upon command but then back to sleep. Right flank area had softball sized pink area with small ( less than dime sized ) blood blister intact. Family at bedside  Also intrepreter

## 2014-03-09 ENCOUNTER — Other Ambulatory Visit: Payer: Self-pay | Admitting: Urology

## 2014-03-12 ENCOUNTER — Encounter (HOSPITAL_BASED_OUTPATIENT_CLINIC_OR_DEPARTMENT_OTHER): Payer: Self-pay | Admitting: *Deleted

## 2014-03-13 ENCOUNTER — Encounter (HOSPITAL_BASED_OUTPATIENT_CLINIC_OR_DEPARTMENT_OTHER): Payer: Self-pay | Admitting: *Deleted

## 2014-03-13 ENCOUNTER — Emergency Department (HOSPITAL_COMMUNITY): Payer: 59

## 2014-03-13 ENCOUNTER — Encounter (HOSPITAL_COMMUNITY): Payer: Self-pay | Admitting: Emergency Medicine

## 2014-03-13 ENCOUNTER — Emergency Department (HOSPITAL_COMMUNITY)
Admission: EM | Admit: 2014-03-13 | Discharge: 2014-03-13 | Disposition: A | Discharge status: Home / Self Care | Payer: 59 | Attending: Emergency Medicine | Admitting: Emergency Medicine

## 2014-03-13 DIAGNOSIS — Z87442 Personal history of urinary calculi: Secondary | ICD-10-CM | POA: Insufficient documentation

## 2014-03-13 DIAGNOSIS — Z79899 Other long term (current) drug therapy: Secondary | ICD-10-CM | POA: Insufficient documentation

## 2014-03-13 DIAGNOSIS — R300 Dysuria: Secondary | ICD-10-CM | POA: Insufficient documentation

## 2014-03-13 DIAGNOSIS — R109 Unspecified abdominal pain: Secondary | ICD-10-CM | POA: Insufficient documentation

## 2014-03-13 DIAGNOSIS — I10 Essential (primary) hypertension: Secondary | ICD-10-CM | POA: Insufficient documentation

## 2014-03-13 LAB — CBC WITH DIFFERENTIAL/PLATELET
Basophils Absolute: 0 10*3/uL (ref 0.0–0.1)
Basophils Relative: 0 % (ref 0–1)
EOS PCT: 1 % (ref 0–5)
Eosinophils Absolute: 0.1 10*3/uL (ref 0.0–0.7)
HCT: 46.7 % (ref 39.0–52.0)
Hemoglobin: 16.8 g/dL (ref 13.0–17.0)
LYMPHS ABS: 1.6 10*3/uL (ref 0.7–4.0)
LYMPHS PCT: 13 % (ref 12–46)
MCH: 25.6 pg — ABNORMAL LOW (ref 26.0–34.0)
MCHC: 36 g/dL (ref 30.0–36.0)
MCV: 71.3 fL — AB (ref 78.0–100.0)
MONOS PCT: 6 % (ref 3–12)
Monocytes Absolute: 0.7 10*3/uL (ref 0.1–1.0)
NEUTROS PCT: 80 % — AB (ref 43–77)
Neutro Abs: 9.9 10*3/uL — ABNORMAL HIGH (ref 1.7–7.7)
PLATELETS: 247 10*3/uL (ref 150–400)
RBC: 6.55 MIL/uL — AB (ref 4.22–5.81)
RDW: 12.7 % (ref 11.5–15.5)
WBC: 12.3 10*3/uL — AB (ref 4.0–10.5)

## 2014-03-13 LAB — COMPREHENSIVE METABOLIC PANEL
ALBUMIN: 4.3 g/dL (ref 3.5–5.2)
ALT: 16 U/L (ref 0–53)
AST: 33 U/L (ref 0–37)
Alkaline Phosphatase: 64 U/L (ref 39–117)
BILIRUBIN TOTAL: 0.8 mg/dL (ref 0.3–1.2)
BUN: 16 mg/dL (ref 6–23)
CHLORIDE: 100 meq/L (ref 96–112)
CO2: 20 mEq/L (ref 19–32)
Calcium: 10.2 mg/dL (ref 8.4–10.5)
Creatinine, Ser: 1.18 mg/dL (ref 0.50–1.35)
GFR calc non Af Amer: 79 mL/min — ABNORMAL LOW (ref >90)
Glucose, Bld: 94 mg/dL (ref 70–99)
Potassium: 3.5 mEq/L — ABNORMAL LOW (ref 3.7–5.3)
Sodium: 139 mEq/L (ref 137–147)
Total Protein: 8.1 g/dL (ref 6.0–8.3)

## 2014-03-13 LAB — URINALYSIS, ROUTINE W REFLEX MICROSCOPIC
BILIRUBIN URINE: NEGATIVE
Glucose, UA: NEGATIVE mg/dL
Ketones, ur: NEGATIVE mg/dL
Leukocytes, UA: NEGATIVE
NITRITE: NEGATIVE
Protein, ur: NEGATIVE mg/dL
Specific Gravity, Urine: 1.011 (ref 1.005–1.030)
UROBILINOGEN UA: 0.2 mg/dL (ref 0.0–1.0)
pH: 7 (ref 5.0–8.0)

## 2014-03-13 LAB — URINE MICROSCOPIC-ADD ON

## 2014-03-13 LAB — I-STAT CHEM 8, ED
BUN: 16 mg/dL (ref 6–23)
CALCIUM ION: 1.04 mmol/L — AB (ref 1.12–1.23)
Chloride: 105 mEq/L (ref 96–112)
Creatinine, Ser: 1.2 mg/dL (ref 0.50–1.35)
GLUCOSE: 98 mg/dL (ref 70–99)
HEMATOCRIT: 52 % (ref 39.0–52.0)
HEMOGLOBIN: 17.7 g/dL — AB (ref 13.0–17.0)
Potassium: 3 mEq/L — ABNORMAL LOW (ref 3.7–5.3)
Sodium: 138 mEq/L (ref 137–147)
TCO2: 18 mmol/L (ref 0–100)

## 2014-03-13 LAB — LIPASE, BLOOD: LIPASE: 94 U/L — AB (ref 11–59)

## 2014-03-13 MED ORDER — ONDANSETRON HCL 4 MG PO TABS: 4.0000 mg | ORAL_TABLET | Freq: Four times a day (QID) | ORAL | Status: DC

## 2014-03-13 MED ORDER — HYDROCODONE-ACETAMINOPHEN 5-325 MG PO TABS: 1.0000 | ORAL_TABLET | Freq: Four times a day (QID) | ORAL | Status: DC | PRN

## 2014-03-13 MED ORDER — ONDANSETRON HCL 4 MG/2ML IJ SOLN
4.0000 mg | Freq: Once | INTRAMUSCULAR | Status: AC
  Administered 2014-03-13: 4 mg via INTRAVENOUS
  Filled 2014-03-13: qty 2

## 2014-03-13 MED ORDER — HYDROCODONE-ACETAMINOPHEN 5-325 MG PO TABS
2.0000 | ORAL_TABLET | ORAL | Status: AC
  Administered 2014-03-13: 2 via ORAL
  Filled 2014-03-13: qty 2

## 2014-03-13 MED ORDER — HYDROMORPHONE HCL PF 1 MG/ML IJ SOLN
1.0000 mg | Freq: Once | INTRAMUSCULAR | Status: AC
  Administered 2014-03-13: 1 mg via INTRAVENOUS
  Filled 2014-03-13: qty 1

## 2014-03-13 MED ORDER — ONDANSETRON 4 MG PO TBDP
ORAL_TABLET | ORAL | Status: AC
  Filled 2014-03-13: qty 2

## 2014-03-13 MED ORDER — HYDROCHLOROTHIAZIDE 25 MG PO TABS: 25.0000 mg | ORAL_TABLET | Freq: Every day | ORAL | Status: DC

## 2014-03-13 NOTE — ED Provider Notes (Signed)
Medical screening examination/treatment/procedure(s) were performed by non-physician practitioner and as supervising physician I was immediately available for consultation/collaboration.   EKG Interpretation None        Gwyneth SproutWhitney Jamaiyah Pyle, MD 03/13/14 [phone]

## 2014-03-13 NOTE — ED Notes (Signed)
Pt was being discharged, was wheeled into lobby where he began to have nausea and vomit.  Zofran 8mg  ODT given, pt opted not to be placed back in room, requests to go home.  Additional prescription given for zofran.  Wife verbalized understanding.

## 2014-03-13 NOTE — ED Notes (Signed)
Patient transported to CT 

## 2014-03-13 NOTE — ED Notes (Signed)
33 yo male with severe abdominal pain on the left lower quad radiating around the flank area. Denies hematuria but states he has very little urination, has nausea with no vomiting. Pt is alert oriented and has HX of Kid Stones and some that have not yet passed.

## 2014-03-13 NOTE — ED Notes (Signed)
Pt brought to room via wheelchair with family in tow; pt getting undressed and into a gown at this time; Huntley DecSara, Vermont[place] and NadaBrandi, RN present in room

## 2014-03-13 NOTE — ED Provider Notes (Signed)
CSN: [id number]     Arrival date & time 03/13/14  0757 History   First MD Initiated Contact with Patient 03/13/14 [phone]     Chief Complaint  Patient presents with  . Abdominal Pain  . Shortness of Breath     (Consider location/radiation/quality/duration/timing/severity/associated sxs/prior Treatment) HPI Comments: Patient presents to the emergency department with chief complaint of left-sided flank and abdominal pain. He has history of kidney stones. He states that this feels the same. He complains of urinary hesitancy, but denies any hematuria or dysuria. He complains of associated nausea, but no vomiting. He recently had a lithotripsy treatments in May for kidney stones on the same side. He denies fevers or chills. He has not taken anything to alleviate her symptoms. He is followed by Dr. Isabel CapriceGrapey.  The history is provided by the patient. No language interpreter was used.    Past Medical History  Diagnosis Date  . Hypertension   . History of kidney stones 12/2013  . Renal calculus, right    Past Surgical History  Procedure Laterality Date  . Extracorporeal shock wave lithotripsy Right 02-02-2014  . Cysto/  left retrograde pyelogram/  left ureteral stent placement  10-17-2010  . Nephrolithotomy Left 11-21-2010   No family history on file. History  Substance Use Topics  . Smoking status: Never Smoker   . Smokeless tobacco: Not on file  . Alcohol Use: No    Review of Systems  Constitutional: Negative for fever and chills.  Respiratory: Negative for shortness of breath.   Cardiovascular: Negative for chest pain.  Gastrointestinal: Negative for nausea, vomiting, diarrhea and constipation.  Genitourinary: Positive for flank pain and difficulty urinating. Negative for dysuria.  All other systems reviewed and are negative.     Allergies  Review of patient's allergies indicates no known allergies.  Home Medications   Prior to Admission medications   Medication Sig Start Date  End Date Taking? Authorizing Provider  acetaminophen (TYLENOL) 500 MG tablet Take 1,000 mg by mouth every 6 (six) hours as needed for mild pain.   Yes Historical Provider, MD  hydrochlorothiazide (HYDRODIURIL) 25 MG tablet Take 1 tablet (25 mg total) by mouth daily. 06/26/13  Yes Zachary H Baker, PA-C   BP 129/97  Temp(Src) 97.5 F (36.4 C) (Oral)  Resp 34  SpO2 99% Physical Exam  Nursing note and vitals reviewed. Constitutional: He is oriented to person, place, and time. He appears well-developed and well-nourished.  Patient is obviously uncomfortable  HENT:  Head: Normocephalic and atraumatic.  Eyes: Conjunctivae and EOM are normal. Pupils are equal, round, and reactive to light. Right eye exhibits no discharge. Left eye exhibits no discharge. No scleral icterus.  Neck: Normal range of motion. Neck supple. No JVD present.  Cardiovascular: Normal rate, regular rhythm and normal heart sounds.  Exam reveals no gallop and no friction rub.   No murmur heard. Pulmonary/Chest: Effort normal and breath sounds normal. No respiratory distress. He has no wheezes. He has no rales. He exhibits no tenderness.  Abdominal: Soft. He exhibits no distension and no mass. There is no tenderness. There is no rebound and no guarding.  Left-sided CVA tenderness, and left-sided abdominal tenderness, no right-sided abdominal tenderness  Musculoskeletal: Normal range of motion. He exhibits no edema and no tenderness.  Neurological: He is alert and oriented to person, place, and time.  Skin: Skin is warm and dry.  Psychiatric: He has a normal mood and affect. His behavior is normal. Judgment and thought content normal.  ED Course  Procedures (including critical care time) Results for orders placed during the hospital encounter of 03/13/14  URINALYSIS, ROUTINE W REFLEX MICROSCOPIC      Result Value Ref Range   Color, Urine STRAW (*) YELLOW   APPearance CLEAR  CLEAR   Specific Gravity, Urine 1.011  1.005 -  1.030   pH 7.0  5.0 - 8.0   Glucose, UA NEGATIVE  NEGATIVE mg/dL   Hgb urine dipstick SMALL (*) NEGATIVE   Bilirubin Urine NEGATIVE  NEGATIVE   Ketones, ur NEGATIVE  NEGATIVE mg/dL   Protein, ur NEGATIVE  NEGATIVE mg/dL   Urobilinogen, UA 0.2  0.0 - 1.0 mg/dL   Nitrite NEGATIVE  NEGATIVE   Leukocytes, UA NEGATIVE  NEGATIVE  COMPREHENSIVE METABOLIC PANEL      Result Value Ref Range   Sodium 139  137 - 147 mEq/L   Potassium 3.5 (*) 3.7 - 5.3 mEq/L   Chloride 100  96 - 112 mEq/L   CO2 20  19 - 32 mEq/L   Glucose, Bld 94  70 - 99 mg/dL   BUN 16  6 - 23 mg/dL   Creatinine, Ser 1.611.18  0.50 - 1.35 mg/dL   Calcium 09.610.2  8.4 - 04.510.5 mg/dL   Total Protein 8.1  6.0 - 8.3 g/dL   Albumin 4.3  3.5 - 5.2 g/dL   AST 33  0 - 37 U/L   ALT 16  0 - 53 U/L   Alkaline Phosphatase 64  39 - 117 U/L   Total Bilirubin 0.8  0.3 - 1.2 mg/dL   GFR calc non Af Amer 79 (*) >90 mL/min   GFR calc Af Amer >90  >90 mL/min  CBC WITH DIFFERENTIAL      Result Value Ref Range   WBC 12.3 (*) 4.0 - 10.5 K/uL   RBC 6.55 (*) 4.22 - 5.81 MIL/uL   Hemoglobin 16.8  13.0 - 17.0 g/dL   HCT 40.946.7  81.139.0 - 91.452.0 %   MCV 71.3 (*) 78.0 - 100.0 fL   MCH 25.6 (*) 26.0 - 34.0 pg   MCHC 36.0  30.0 - 36.0 g/dL   RDW 78.212.7  95.611.5 - 21.315.5 %   Platelets 247  150 - 400 K/uL   Neutrophils Relative % 80 (*) 43 - 77 %   Lymphocytes Relative 13  12 - 46 %   Monocytes Relative 6  3 - 12 %   Eosinophils Relative 1  0 - 5 %   Basophils Relative 0  0 - 1 %   Neutro Abs 9.9 (*) 1.7 - 7.7 K/uL   Lymphs Abs 1.6  0.7 - 4.0 K/uL   Monocytes Absolute 0.7  0.1 - 1.0 K/uL   Eosinophils Absolute 0.1  0.0 - 0.7 K/uL   Basophils Absolute 0.0  0.0 - 0.1 K/uL   WBC Morphology ATYPICAL LYMPHOCYTES    LIPASE, BLOOD      Result Value Ref Range   Lipase 94 (*) 11 - 59 U/L  URINE MICROSCOPIC-ADD ON      Result Value Ref Range   Squamous Epithelial / LPF RARE  RARE   WBC, UA 0-2  <3 WBC/hpf   RBC / HPF 3-6  <3 RBC/hpf   Bacteria, UA RARE  RARE  I-STAT  CHEM 8, ED      Result Value Ref Range   Sodium 138  137 - 147 mEq/L   Potassium 3.0 (*) 3.7 - 5.3 mEq/L   Chloride 105  96 - 112 mEq/L   BUN 16  6 - 23 mg/dL   Creatinine, Ser 1.61  0.50 - 1.35 mg/dL   Glucose, Bld 98  70 - 99 mg/dL   Calcium, Ion 0.96 (*) 1.12 - 1.23 mmol/L   TCO2 18  0 - 100 mmol/L   Hemoglobin 17.7 (*) 13.0 - 17.0 g/dL   HCT 04.5  40.9 - 81.1 %   Ct Abdomen Pelvis Wo Contrast  03/13/2014   CLINICAL DATA:  Left-sided abdominal and flank pain. Nausea. History of kidney stones.  EXAM: CT ABDOMEN AND PELVIS WITHOUT CONTRAST  TECHNIQUE: Multidetector CT imaging of the abdomen and pelvis was performed following the standard protocol without IV contrast.  COMPARISON:  11/22/2010  FINDINGS: The lung bases are unremarkable.  Abdominal images demonstrate a normal liver, spleen, pancreas, gallbladder and adrenal glands. There is a small appendicolith as the appendix is otherwise within normal. There is minimal diverticulosis of the colon.  Kidneys are normal in size. There is a 1.1 cm stone over the lower pole collecting system of the right kidney which is slightly larger. There is mild to moderate chronic dilatation of the left intrarenal collecting system with layering calcification over the mid to lower pole of the collecting system without significant change. Ureters are within normal without evidence of stones.  Pelvic images demonstrate a 3 mm stone over the posterior dependent portion gallbladder just right of midline. Remaining pelvic structures are unremarkable. Remainder of the exam is unchanged.  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 No ureteral stones identified. 3 mm stone over the posterior dependent portion of the bladder just right of midline which may have been recently passed from the left collecting system.  Diverticulosis of the colon.   Electronically Signed   By: Elberta Fortis M.D.   On:  03/13/2014 09:09      EKG Interpretation None      MDM   Final diagnoses:  Flank pain    Patient with history of kidney stones. Complaint of left flank pain, but feels like a kidney stone. He has decreased function of his left kidney, therefore will hold toradol, and will give him a dose of Dilaudid.  10:54 AM Patient reassessed, still complains of 6/10 pain, but is very groggy and sleeping. He appears comfortable. Will give him some Vicodin by mouth, and will reevaluate. CT remarkable for recently passed left-sided kidney stone. Suspect was the source of the patient's pain. Anticipate discharge to home. Patient discussed with Dr. Anitra Lauth, who agrees with plan.  12:12 PM Pain is now a 3/10.  Will discharge to home with pain meds.  Recommend urology follow-up.  Patient is stable and ready for discharge.  Roxy Horseman, PA-C 03/13/14 1213

## 2014-03-13 NOTE — Discharge Instructions (Signed)
Your CT scan shows that your kidney stone has passed.  You pain should improve now.  Kidney Stones Kidney stones (urolithiasis) are deposits that form inside your kidneys. The intense pain is caused by the stone moving through the urinary tract. When the stone moves, the ureter goes into spasm around the stone. The stone is usually passed in the urine.  CAUSES   A disorder that makes certain neck glands produce too much parathyroid hormone (primary hyperparathyroidism).  A buildup of uric acid crystals, similar to gout in your joints.  Narrowing (stricture) of the ureter.  A kidney obstruction present at birth (congenital obstruction).  Previous surgery on the kidney or ureters.  Numerous kidney infections. SYMPTOMS   Feeling sick to your stomach (nauseous).  Throwing up (vomiting).  Blood in the urine (hematuria).  Pain that usually spreads (radiates) to the groin.  Frequency or urgency of urination. DIAGNOSIS   Taking a history and physical exam.  Blood or urine tests.  CT scan.  Occasionally, an examination of the inside of the urinary bladder (cystoscopy) is performed. TREATMENT   Observation.  Increasing your fluid intake.  Extracorporeal shock wave lithotripsy This is a noninvasive procedure that uses shock waves to break up kidney stones.  Surgery may be needed if you have severe pain or persistent obstruction. There are various surgical procedures. Most of the procedures are performed with the use of small instruments. Only small incisions are needed to accommodate these instruments, so recovery time is minimized. The size, location, and chemical composition are all important variables that will determine the proper choice of action for you. Talk to your health care provider to better understand your situation so that you will minimize the risk of injury to yourself and your kidney.  HOME CARE INSTRUCTIONS   Drink enough water and fluids to keep your urine  clear or pale yellow. This will help you to pass the stone or stone fragments.  Strain all urine through the provided strainer. Keep all particulate matter and stones for your health care provider to see. The stone causing the pain may be as small as a grain of salt. It is very important to use the strainer each and every time you pass your urine. The collection of your stone will allow your health care provider to analyze it and verify that a stone has actually passed. The stone analysis will often identify what you can do to reduce the incidence of recurrences.  Only take over-the-counter or prescription medicines for pain, discomfort, or fever as directed by your health care provider.  Make a follow-up appointment with your health care provider as directed.  Get follow-up X-rays if required. The absence of pain does not always mean that the stone has passed. It may have only stopped moving. If the urine remains completely obstructed, it can cause loss of kidney function or even complete destruction of the kidney. It is your responsibility to make sure X-rays and follow-ups are completed. Ultrasounds of the kidney can show blockages and the status of the kidney. Ultrasounds are not associated with any radiation and can be performed easily in a matter of minutes. SEEK MEDICAL CARE IF:  You experience pain that is progressive and unresponsive to any pain medicine you have been prescribed. SEEK IMMEDIATE MEDICAL CARE IF:   Pain cannot be controlled with the prescribed medicine.  You have a fever or shaking chills.  The severity or intensity of pain increases over 18 hours and is not relieved by  pain medicine.  You develop a new onset of abdominal pain.  You feel faint or pass out.  You are unable to urinate. MAKE SURE YOU:   Understand these instructions.  Will watch your condition.  Will get help right away if you are not doing well or get worse. Document Released: 09/18/2005 Document  Revised: 05/21/2013 Document Reviewed: 02/19/2013 [facility] Patient Information 2014 [place] [employer], [place].

## 2014-03-23 ENCOUNTER — Encounter (HOSPITAL_BASED_OUTPATIENT_CLINIC_OR_DEPARTMENT_OTHER): Admission: RE | Payer: Self-pay | Source: Ambulatory Visit

## 2014-03-23 ENCOUNTER — Ambulatory Visit (HOSPITAL_BASED_OUTPATIENT_CLINIC_OR_DEPARTMENT_OTHER)
Admission: RE | Admit: 2014-03-23 | Payer: No Typology Code available for payment source | Source: Ambulatory Visit | Admitting: Urology

## 2014-03-23 HISTORY — DX: Calculus of kidney: N20.0

## 2014-03-23 SURGERY — CYSTOURETEROSCOPY, WITH STENT INSERTION
Anesthesia: General | Laterality: Right

## 2014-09-03 ENCOUNTER — Encounter (HOSPITAL_COMMUNITY): Payer: Self-pay | Admitting: *Deleted

## 2014-09-03 ENCOUNTER — Encounter (HOSPITAL_BASED_OUTPATIENT_CLINIC_OR_DEPARTMENT_OTHER): Payer: Self-pay | Admitting: Emergency Medicine

## 2014-09-03 ENCOUNTER — Emergency Department (HOSPITAL_COMMUNITY): Payer: 59

## 2014-09-03 ENCOUNTER — Emergency Department (HOSPITAL_COMMUNITY)
Admission: EM | Admit: 2014-09-03 | Discharge: 2014-09-03 | Disposition: A | Discharge status: Home / Self Care | Payer: 59 | Attending: Emergency Medicine | Admitting: Emergency Medicine

## 2014-09-03 DIAGNOSIS — Z79899 Other long term (current) drug therapy: Secondary | ICD-10-CM | POA: Insufficient documentation

## 2014-09-03 DIAGNOSIS — R10A1 Flank pain, right side: Secondary | ICD-10-CM

## 2014-09-03 DIAGNOSIS — R109 Unspecified abdominal pain: Secondary | ICD-10-CM

## 2014-09-03 DIAGNOSIS — N201 Calculus of ureter: Secondary | ICD-10-CM

## 2014-09-03 DIAGNOSIS — I10 Essential (primary) hypertension: Secondary | ICD-10-CM | POA: Diagnosis not present

## 2014-09-03 DIAGNOSIS — Z9889 Other specified postprocedural states: Secondary | ICD-10-CM | POA: Diagnosis not present

## 2014-09-03 DIAGNOSIS — R112 Nausea with vomiting, unspecified: Secondary | ICD-10-CM | POA: Insufficient documentation

## 2014-09-03 LAB — URINALYSIS, ROUTINE W REFLEX MICROSCOPIC
BILIRUBIN URINE: NEGATIVE
GLUCOSE, UA: NEGATIVE mg/dL
KETONES UR: NEGATIVE mg/dL
Leukocytes, UA: NEGATIVE
Nitrite: NEGATIVE
PROTEIN: NEGATIVE mg/dL
Specific Gravity, Urine: 1.009 (ref 1.005–1.030)
UROBILINOGEN UA: 0.2 mg/dL (ref 0.0–1.0)
pH: 7.5 (ref 5.0–8.0)

## 2014-09-03 LAB — CBC WITH DIFFERENTIAL/PLATELET
BASOS PCT: 1 % (ref 0–1)
Basophils Absolute: 0.1 10*3/uL (ref 0.0–0.1)
EOS PCT: 2 % (ref 0–5)
Eosinophils Absolute: 0.2 10*3/uL (ref 0.0–0.7)
HCT: 44.6 % (ref 39.0–52.0)
HEMOGLOBIN: 16 g/dL (ref 13.0–17.0)
Lymphocytes Relative: 39 % (ref 12–46)
Lymphs Abs: 3.4 10*3/uL (ref 0.7–4.0)
MCH: 25.1 pg — AB (ref 26.0–34.0)
MCHC: 35.9 g/dL (ref 30.0–36.0)
MCV: 70 fL — AB (ref 78.0–100.0)
MONO ABS: 0.6 10*3/uL (ref 0.1–1.0)
Monocytes Relative: 7 % (ref 3–12)
NEUTROS PCT: 51 % (ref 43–77)
Neutro Abs: 4.5 10*3/uL (ref 1.7–7.7)
Platelets: 264 10*3/uL (ref 150–400)
RBC: 6.37 MIL/uL — ABNORMAL HIGH (ref 4.22–5.81)
RDW: 12.6 % (ref 11.5–15.5)
WBC: 8.8 10*3/uL (ref 4.0–10.5)

## 2014-09-03 LAB — COMPREHENSIVE METABOLIC PANEL
ALK PHOS: 64 U/L (ref 39–117)
ALT: 15 U/L (ref 0–53)
AST: 28 U/L (ref 0–37)
Albumin: 4 g/dL (ref 3.5–5.2)
Anion gap: 16 — ABNORMAL HIGH (ref 5–15)
BILIRUBIN TOTAL: 0.4 mg/dL (ref 0.3–1.2)
BUN: 23 mg/dL (ref 6–23)
CHLORIDE: 97 meq/L (ref 96–112)
CO2: 25 mEq/L (ref 19–32)
Calcium: 9.6 mg/dL (ref 8.4–10.5)
Creatinine, Ser: 1.53 mg/dL — ABNORMAL HIGH (ref 0.50–1.35)
GFR calc non Af Amer: 57 mL/min — ABNORMAL LOW (ref >90)
GFR, EST AFRICAN AMERICAN: 67 mL/min — AB (ref >90)
Glucose, Bld: 124 mg/dL — ABNORMAL HIGH (ref 70–99)
POTASSIUM: 3.1 meq/L — AB (ref 3.7–5.3)
Sodium: 138 mEq/L (ref 137–147)
Total Protein: 7.9 g/dL (ref 6.0–8.3)

## 2014-09-03 LAB — URINE MICROSCOPIC-ADD ON

## 2014-09-03 LAB — LIPASE, BLOOD: LIPASE: 50 U/L (ref 11–59)

## 2014-09-03 MED ORDER — OXYCODONE-ACETAMINOPHEN 5-325 MG PO TABS: 1.0000 | ORAL_TABLET | ORAL | Status: DC | PRN

## 2014-09-03 MED ORDER — ONDANSETRON 8 MG PO TBDP: 8.0000 mg | ORAL_TABLET | Freq: Three times a day (TID) | ORAL | Status: DC | PRN

## 2014-09-03 MED ORDER — HYDROMORPHONE HCL 1 MG/ML IJ SOLN: 1.0000 mg | Freq: Once | INTRAMUSCULAR | Status: DC

## 2014-09-03 MED ORDER — HYDROMORPHONE HCL 1 MG/ML IJ SOLN
1.5000 mg | Freq: Once | INTRAMUSCULAR | Status: AC
  Administered 2014-09-03: 1.5 mg via INTRAVENOUS
  Filled 2014-09-03: qty 2

## 2014-09-03 MED ORDER — TAMSULOSIN HCL 0.4 MG PO CAPS: 0.4000 mg | ORAL_CAPSULE | Freq: Every day | ORAL | Status: DC

## 2014-09-03 MED ORDER — IBUPROFEN 600 MG PO TABS: 600.0000 mg | ORAL_TABLET | Freq: Three times a day (TID) | ORAL | Status: DC | PRN

## 2014-09-03 MED ORDER — KETOROLAC TROMETHAMINE 30 MG/ML IJ SOLN
30.0000 mg | Freq: Once | INTRAMUSCULAR | Status: AC
  Administered 2014-09-03: 30 mg via INTRAVENOUS
  Filled 2014-09-03: qty 1

## 2014-09-03 MED ORDER — ONDANSETRON HCL 4 MG/2ML IJ SOLN
4.0000 mg | Freq: Once | INTRAMUSCULAR | Status: AC
  Administered 2014-09-03: 4 mg via INTRAVENOUS
  Filled 2014-09-03: qty 2

## 2014-09-03 MED ORDER — HYDROMORPHONE HCL 1 MG/ML IJ SOLN
1.0000 mg | Freq: Once | INTRAMUSCULAR | Status: AC
  Administered 2014-09-03: 1 mg via INTRAVENOUS
  Filled 2014-09-03: qty 1

## 2014-09-03 MED ORDER — KETOROLAC TROMETHAMINE 30 MG/ML IJ SOLN: 30.0000 mg | Freq: Once | INTRAMUSCULAR | Status: DC

## 2014-09-03 NOTE — ED Notes (Signed)
Patient transported to CT 

## 2014-09-03 NOTE — ED Notes (Signed)
Pt returned from CT. Pt in no apparent distress  

## 2014-09-03 NOTE — ED Notes (Signed)
The pt is c/o severe abd pain  And something about a kidney stone.  Unable to  Get very mujch informatiion from the pt he speaks little or no english

## 2014-09-03 NOTE — Discharge Instructions (Signed)
Ureteral Colic (Kidney Stones) °Ureteral colic is the result of a condition when kidney stones form inside the kidney. Once kidney stones are formed they may move into the tube that connects the kidney with the bladder (ureter). If this occurs, this condition may cause pain (colic) in the ureter.  °CAUSES  °Pain is caused by stone movement in the ureter and the obstruction caused by the stone. °SYMPTOMS  °The pain comes and goes as the ureter contracts around the stone. The pain is usually intense, sharp, and stabbing in character. The location of the pain may move as the stone moves through the ureter. When the stone is near the kidney the pain is usually located in the back and radiates to the belly (abdomen). When the stone is ready to pass into the bladder the pain is often located in the lower abdomen on the side the stone is located. At this location, the symptoms may mimic those of a urinary tract infection with urinary frequency. Once the stone is located here it often passes into the bladder and the pain disappears completely. °TREATMENT  °· Your caregiver will provide you with medicine for pain relief. °· You may require specialized follow-up X-rays. °· The absence of pain does not always mean that the stone has passed. It may have just stopped moving. If the urine remains completely obstructed, it can cause loss of kidney function or even complete destruction of the involved kidney. It is your responsibility and in your interest that X-rays and follow-ups as suggested by your caregiver are completed. Relief of pain without passage of the stone can be associated with severe damage to the kidney, including loss of kidney function on that side. °· If your stone does not pass on its own, additional measures may be taken by your caregiver to ensure its removal. °HOME CARE INSTRUCTIONS  °· Increase your fluid intake. Water is the preferred fluid since juices containing vitamin C may acidify the urine making it  less likely for certain stones (uric acid stones) to pass. °· Strain all urine. A strainer will be provided. Keep all particulate matter or stones for your caregiver to inspect. °· Take your pain medicine as directed. °· Make a follow-up appointment with your caregiver as directed. °· Remember that the goal is passage of your stone. The absence of pain does not mean the stone is gone. Follow your caregiver's instructions. °· Only take over-the-counter or prescription medicines for pain, discomfort, or fever as directed by your caregiver. °SEEK MEDICAL CARE IF:  °· Pain cannot be controlled with the prescribed medicine. °· You have a fever. °· Pain continues for longer than your caregiver advises it should. °· There is a change in the pain, and you develop chest discomfort or constant abdominal pain. °· You feel faint or pass out. °MAKE SURE YOU:  °· Understand these instructions. °· Will watch your condition. °· Will get help right away if you are not doing well or get worse. °Document Released: 06/28/2005 Document Revised: 01/13/2013 Document Reviewed: 03/15/2011 °ExitCare® Patient Information ©2015 [employer]. This information is not intended to replace advice given to you by your health care provider. Make sure you discuss any questions you have with your health care provider. ° °

## 2014-09-03 NOTE — ED Notes (Signed)
Interpreter line does not provide montagnard language. Family member on phone translated for patient.

## 2014-09-03 NOTE — ED Notes (Signed)
MD at bedside. 

## 2014-09-03 NOTE — ED Notes (Signed)
Hx kidney stones

## 2014-09-03 NOTE — ED Provider Notes (Signed)
CSN: [id number]     Arrival date & time 09/03/14  0235 History   This chart was scribed for Lyanne Co, MD by Freida Busman, ED Scribe. This patient was seen in room D36C/D36C and the patient's care was started 3:40 AM.    Chief Complaint  Patient presents with  . Abdominal Pain     The history is provided by the patient and a relative (who helped translate). No language interpreter was used.    HPI Comments:  Jeremiah [name] is a 33 y.o. male with a h/o kidney stones who presents to the Emergency Department complaining of moderate constant right flank pain that started last night. He states pain radiates into his right abdomen.  Per family memeber pt also reported associated nausea, vomiting, urinary urgency and dysuria. He denies fever. No alleviating factors noted.   Past Medical History  Diagnosis Date  . Hypertension   . History of kidney stones 12/2013  . Nephrolithiasis     BILATERAL --- PER CT  03-13-2014    Past Surgical History  Procedure Laterality Date  . Extracorporeal shock wave lithotripsy Right 02-02-2014  . Cysto/  left retrograde pyelogram/  left ureteral stent placement  10-17-2010  . Nephrolithotomy Left 11-21-2010   No family history on file. History  Substance Use Topics  . Smoking status: Never Smoker   . Smokeless tobacco: Not on file  . Alcohol Use: No    Review of Systems  A complete 10 system review of systems was obtained and all systems are negative except as noted in the HPI and PMH.    Allergies  Review of patient's allergies indicates no known allergies.  Home Medications   Prior to Admission medications   Medication Sig Start Date End Date Taking? Authorizing Provider  acetaminophen (TYLENOL) 500 MG tablet Take 1,000 mg by mouth every 6 (six) hours as needed for mild pain.    Historical Provider, MD  hydrochlorothiazide (HYDRODIURIL) 25 MG tablet Take 1 tablet (25 mg total) by mouth daily. 03/13/14   Roxy Horseman, PA-C   HYDROcodone-acetaminophen (NORCO/VICODIN) 5-325 MG per tablet Take 1-2 tablets by mouth every 6 (six) hours as needed. 03/13/14   Roxy Horseman, PA-C  ondansetron (ZOFRAN) 4 MG tablet Take 1 tablet (4 mg total) by mouth every 6 (six) hours. 03/13/14   Roxy Horseman, PA-C   BP 169/108 mmHg  Pulse 99  Temp(Src) 97.4 F (36.3 C) (Oral)  Resp 23  SpO2 99% Physical Exam  Constitutional: He is oriented to person, place, and time. He appears well-developed and well-nourished.  Uncomfortable appearing  HENT:  Head: Normocephalic and atraumatic.  Eyes: EOM are normal.  Neck: Normal range of motion.  Cardiovascular: Normal rate, regular rhythm, normal heart sounds and intact distal pulses.   Pulmonary/Chest: Effort normal and breath sounds normal. No respiratory distress.  Abdominal: Soft. He exhibits no distension. There is no tenderness.  Musculoskeletal: Normal range of motion.  Neurological: He is alert and oriented to person, place, and time.  Skin: Skin is warm and dry.  Psychiatric: He has a normal mood and affect. Judgment normal.  Nursing note and vitals reviewed.   ED Course  Procedures   DIAGNOSTIC STUDIES:  Oxygen Saturation is 99% on RA,  normal by my interpretation.    COORDINATION OF CARE:  3:44 AM Discussed treatment plan with pt and family at bedside and pt agreed to plan.  Labs Review Labs Reviewed  CBC WITH DIFFERENTIAL - Abnormal; Notable for the following:  RBC 6.37 (*)    MCV 70.0 (*)    MCH 25.1 (*)    All other components within normal limits  COMPREHENSIVE METABOLIC PANEL - Abnormal; Notable for the following:    Potassium 3.1 (*)    Glucose, Bld 124 (*)    Creatinine, Ser 1.53 (*)    GFR calc non Af Amer 57 (*)    GFR calc Af Amer 67 (*)    Anion gap 16 (*)    All other components within normal limits  URINALYSIS, ROUTINE W REFLEX MICROSCOPIC - Abnormal; Notable for the following:    Hgb urine dipstick MODERATE (*)    All other components  within normal limits  LIPASE, BLOOD  URINE MICROSCOPIC-ADD ON    Imaging Review Ct Abdomen Pelvis Wo Contrast  09/03/2014   CLINICAL DATA:  Acute onset of right flank pain.  Initial encounter.  EXAM: CT ABDOMEN AND PELVIS WITHOUT CONTRAST  TECHNIQUE: Multidetector CT imaging of the abdomen and pelvis was performed following the standard protocol without IV contrast.  COMPARISON:  CT of the abdomen and pelvis from 03/13/2014  FINDINGS: Minimal bibasilar atelectasis is noted.  The liver and spleen are unremarkable in appearance. The gallbladder is within normal limits. The pancreas and adrenal glands are unremarkable.  There is acute onset of mild to moderate right-sided hydronephrosis, with asymmetric enlargement of the right kidney and right-sided perinephric stranding and fluid. There is distention of the right ureter to the level of apparent steinstrasse at the distal right ureter, just above the right vesicoureteral junction. The collection of small stones of the distal ureter measures 1.3 cm in length. A single stone measuring 3 mm appears to have passed, seen at the right side of the base of the bladder.  Chronic moderate left-sided hydronephrosis is again seen, with left ureteral thickening but no definite evidence of distal obstruction. There is associated left renal atrophy. Nonobstructing bilateral renal stones are noted, measuring up to 7 mm on the right and 10 mm on the left.  No free fluid is identified. The small bowel is unremarkable in appearance. The stomach is within normal limits. No acute vascular abnormalities are seen.  The appendix is normal in caliber, without evidence for appendicitis. The colon is unremarkable in appearance.  The bladder is moderately distended and grossly unremarkable in appearance. The prostate remains normal in size. No inguinal lymphadenopathy is seen.  No acute osseous abnormalities are identified. 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  IMPRESSION: 1. Acute onset of mild to moderate right-sided hydronephrosis, with asymmetric enlargement of the right kidney and right-sided perinephric stranding and fluid. 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 This collection of small obstructing stones measures 1.3 cm in length. A single stone measuring 3 mm appears to have passed, seen at the right side at the base of the bladder. 2. Chronic moderate left-sided hydronephrosis, with left ureteral thickening but no definite distal obstruction. Associated left renal atrophy. 3. Nonobstructing bilateral renal stones seen, measuring up to 10 mm in size. 4. Chronic bilateral pars defects at L5, without evidence of anterolisthesis.   Electronically Signed   By: Roanna RaiderJeffery  Chang M.D.   On: 09/03/2014 05:07  I personally reviewed the imaging tests through PACS system I reviewed available ER/hospitalization records through the EMR    EKG Interpretation None      MDM   Final diagnoses:  Right flank pain  Right ureteral  stone    7:11 AM Patient feels much better at this time.  Discharge home with pain medicine, nausea medicine.  Urology follow-up.  He understands to return [facility] emergency department for new or worsening symptoms.  I personally performed the services described in this documentation, which was scribed in my presence. The recorded information has been reviewed and is accurate.      Lyanne CoKevin M Cayci Mcnabb, MD 09/03/14 [phone]

## 2014-09-03 NOTE — ED Notes (Signed)
Pt does not speak any English.

## 2014-12-30 ENCOUNTER — Other Ambulatory Visit: Payer: Self-pay | Admitting: Urology

## 2014-12-30 ENCOUNTER — Encounter (HOSPITAL_COMMUNITY): Payer: Self-pay

## 2014-12-30 ENCOUNTER — Inpatient Hospital Stay (HOSPITAL_COMMUNITY): Payer: 59 | Admitting: Anesthesiology

## 2014-12-30 ENCOUNTER — Encounter (HOSPITAL_COMMUNITY)
Admission: AD | Disposition: A | Discharge status: Home / Self Care | Payer: Self-pay | Source: Ambulatory Visit | Attending: Urology

## 2014-12-30 ENCOUNTER — Inpatient Hospital Stay (HOSPITAL_COMMUNITY)
Admission: AD | Admit: 2014-12-30 | Discharge: 2014-12-31 | DRG: 694 | Disposition: A | Discharge status: Home / Self Care | Payer: 59 | Source: Ambulatory Visit | Attending: Urology | Admitting: Urology

## 2014-12-30 DIAGNOSIS — Z87442 Personal history of urinary calculi: Secondary | ICD-10-CM | POA: Diagnosis not present

## 2014-12-30 DIAGNOSIS — Z79899 Other long term (current) drug therapy: Secondary | ICD-10-CM

## 2014-12-30 DIAGNOSIS — I129 Hypertensive chronic kidney disease with stage 1 through stage 4 chronic kidney disease, or unspecified chronic kidney disease: Secondary | ICD-10-CM | POA: Diagnosis present

## 2014-12-30 DIAGNOSIS — N179 Acute kidney failure, unspecified: Secondary | ICD-10-CM | POA: Diagnosis present

## 2014-12-30 DIAGNOSIS — N189 Chronic kidney disease, unspecified: Secondary | ICD-10-CM | POA: Diagnosis present

## 2014-12-30 DIAGNOSIS — N132 Hydronephrosis with renal and ureteral calculous obstruction: Principal | ICD-10-CM | POA: Diagnosis present

## 2014-12-30 DIAGNOSIS — N201 Calculus of ureter: Secondary | ICD-10-CM | POA: Diagnosis present

## 2014-12-30 HISTORY — PX: CYSTOSCOPY W/ URETERAL STENT PLACEMENT: SHX1429

## 2014-12-30 LAB — BASIC METABOLIC PANEL
ANION GAP: 10 (ref 5–15)
BUN: 37 mg/dL — ABNORMAL HIGH (ref 6–23)
CHLORIDE: 99 mmol/L (ref 96–112)
CO2: 28 mmol/L (ref 19–32)
CREATININE: 2.19 mg/dL — AB (ref 0.50–1.35)
Calcium: 9.6 mg/dL (ref 8.4–10.5)
GFR calc Af Amer: 43 mL/min — ABNORMAL LOW (ref >90)
GFR calc non Af Amer: 37 mL/min — ABNORMAL LOW (ref >90)
GLUCOSE: 96 mg/dL (ref 70–99)
Potassium: 3.6 mmol/L (ref 3.5–5.1)
Sodium: 137 mmol/L (ref 135–145)

## 2014-12-30 SURGERY — CYSTOSCOPY, WITH RETROGRADE PYELOGRAM AND URETERAL STENT INSERTION
Anesthesia: General | Site: Ureter | Laterality: Right

## 2014-12-30 SURGERY — Surgical Case
Anesthesia: *Unknown

## 2014-12-30 MED ORDER — LACTATED RINGERS IV SOLN
INTRAVENOUS | Status: DC | PRN
  Administered 2014-12-30: 16:00:00 via INTRAVENOUS

## 2014-12-30 MED ORDER — DEXTROSE-NACL 5-0.45 % IV SOLN
INTRAVENOUS | Status: DC
Start: 2014-12-30 — End: 2014-12-30
  Administered 2014-12-30: 15:00:00 via INTRAVENOUS

## 2014-12-30 MED ORDER — MIDAZOLAM HCL 5 MG/5ML IJ SOLN
INTRAMUSCULAR | Status: DC | PRN
  Administered 2014-12-30 (×2): 1 mg via INTRAVENOUS

## 2014-12-30 MED ORDER — ONDANSETRON HCL 4 MG/2ML IJ SOLN
INTRAMUSCULAR | Status: DC | PRN
  Administered 2014-12-30: 4 mg via INTRAVENOUS

## 2014-12-30 MED ORDER — HYDROMORPHONE HCL 1 MG/ML IJ SOLN: 0.2500 mg | INTRAMUSCULAR | Status: DC | PRN

## 2014-12-30 MED ORDER — MIDAZOLAM HCL 2 MG/2ML IJ SOLN
INTRAMUSCULAR | Status: AC
  Filled 2014-12-30: qty 2

## 2014-12-30 MED ORDER — PROMETHAZINE HCL 25 MG/ML IJ SOLN: 6.2500 mg | INTRAMUSCULAR | Status: DC | PRN

## 2014-12-30 MED ORDER — LIDOCAINE HCL 2 % EX GEL
CUTANEOUS | Status: AC
  Filled 2014-12-30: qty 10

## 2014-12-30 MED ORDER — IOHEXOL 300 MG/ML  SOLN
INTRAMUSCULAR | Status: DC | PRN
  Administered 2014-12-30: 10 mL

## 2014-12-30 MED ORDER — OXYCODONE HCL 5 MG/5ML PO SOLN: 5.0000 mg | Freq: Once | ORAL | Status: DC | PRN

## 2014-12-30 MED ORDER — LACTATED RINGERS IV SOLN: INTRAVENOUS | Status: DC

## 2014-12-30 MED ORDER — LIDOCAINE HCL (CARDIAC) 20 MG/ML IV SOLN
INTRAVENOUS | Status: DC | PRN
  Administered 2014-12-30: 50 mg via INTRAVENOUS

## 2014-12-30 MED ORDER — OXYCODONE-ACETAMINOPHEN 5-325 MG PO TABS: 1.0000 | ORAL_TABLET | Freq: Four times a day (QID) | ORAL | Status: DC | PRN

## 2014-12-30 MED ORDER — SENNOSIDES-DOCUSATE SODIUM 8.6-50 MG PO TABS
1.0000 | ORAL_TABLET | Freq: Two times a day (BID) | ORAL | Status: DC
  Administered 2014-12-30 – 2014-12-31 (×2): 1 via ORAL
  Filled 2014-12-30 (×2): qty 1

## 2014-12-30 MED ORDER — CETYLPYRIDINIUM CHLORIDE 0.05 % MT LIQD
7.0000 mL | Freq: Two times a day (BID) | OROMUCOSAL | Status: DC
Start: 2014-12-30 — End: 2014-12-31
  Administered 2014-12-30 – 2014-12-31 (×2): 7 mL via OROMUCOSAL

## 2014-12-30 MED ORDER — CEFTRIAXONE SODIUM IN DEXTROSE 40 MG/ML IV SOLN
2.0000 g | INTRAVENOUS | Status: AC
  Administered 2014-12-30: 2 g via INTRAVENOUS

## 2014-12-30 MED ORDER — 0.9 % SODIUM CHLORIDE (POUR BTL) OPTIME
TOPICAL | Status: DC | PRN
  Administered 2014-12-30: 1000 mL

## 2014-12-30 MED ORDER — SODIUM CHLORIDE 0.9 % IR SOLN
Status: DC | PRN
  Administered 2014-12-30: 3000 mL

## 2014-12-30 MED ORDER — HYDROMORPHONE HCL 1 MG/ML IJ SOLN: 0.5000 mg | INTRAMUSCULAR | Status: DC | PRN

## 2014-12-30 MED ORDER — DEXTROSE-NACL 5-0.45 % IV SOLN
INTRAVENOUS | Status: DC
  Administered 2014-12-30: 23:00:00 via INTRAVENOUS

## 2014-12-30 MED ORDER — OXYCODONE HCL 5 MG PO TABS: 5.0000 mg | ORAL_TABLET | Freq: Once | ORAL | Status: DC | PRN

## 2014-12-30 MED ORDER — DEXTROSE 5 % IV SOLN
INTRAVENOUS | Status: AC
  Filled 2014-12-30: qty 2

## 2014-12-30 MED ORDER — CHLORHEXIDINE GLUCONATE 0.12 % MT SOLN
15.0000 mL | Freq: Two times a day (BID) | OROMUCOSAL | Status: DC
  Administered 2014-12-30 – 2014-12-31 (×2): 15 mL via OROMUCOSAL
  Filled 2014-12-30 (×2): qty 15

## 2014-12-30 MED ORDER — BELLADONNA ALKALOIDS-OPIUM 16.2-60 MG RE SUPP
RECTAL | Status: AC
  Filled 2014-12-30: qty 1

## 2014-12-30 MED ORDER — PROPOFOL 10 MG/ML IV BOLUS
INTRAVENOUS | Status: AC
  Filled 2014-12-30: qty 20

## 2014-12-30 MED ORDER — FENTANYL CITRATE 0.05 MG/ML IJ SOLN
INTRAMUSCULAR | Status: DC | PRN
  Administered 2014-12-30 (×3): 25 ug via INTRAVENOUS

## 2014-12-30 MED ORDER — FENTANYL CITRATE 0.05 MG/ML IJ SOLN
INTRAMUSCULAR | Status: AC
  Filled 2014-12-30: qty 2

## 2014-12-30 MED ORDER — PROPOFOL 10 MG/ML IV BOLUS
INTRAVENOUS | Status: DC | PRN
  Administered 2014-12-30: 100 mg via INTRAVENOUS

## 2014-12-30 SURGICAL SUPPLY — 29 items
BAG URO CATCHER STRL LF (DRAPE) ×3 IMPLANT
BASKET LASER NITINOL 1.9FR (BASKET) IMPLANT
CATH INTERMIT  6FR 70CM (CATHETERS) ×3 IMPLANT
CLOTH BEACON ORANGE TIMEOUT ST (SAFETY) ×3 IMPLANT
FIBER LASER FLEXIVA 1000 (UROLOGICAL SUPPLIES) IMPLANT
FIBER LASER FLEXIVA 200 (UROLOGICAL SUPPLIES) IMPLANT
FIBER LASER FLEXIVA 365 (UROLOGICAL SUPPLIES) IMPLANT
FIBER LASER FLEXIVA 550 (UROLOGICAL SUPPLIES) IMPLANT
FIBER LASER TRAC TIP (UROLOGICAL SUPPLIES) IMPLANT
GLOVE BIOGEL M STRL SZ7.5 (GLOVE) ×3 IMPLANT
GLOVE BIOGEL PI IND STRL 7.5 (GLOVE) ×1 IMPLANT
GLOVE BIOGEL PI INDICATOR 7.5 (GLOVE) ×2
GLOVE SURG SS PI 7.5 STRL IVOR (GLOVE) ×3 IMPLANT
GOWN STRL REUS W/TWL 2XL LVL3 (GOWN DISPOSABLE) ×3 IMPLANT
GOWN STRL REUS W/TWL LRG LVL3 (GOWN DISPOSABLE) ×6 IMPLANT
GUIDEWIRE ANG ZIPWIRE 038X150 (WIRE) IMPLANT
GUIDEWIRE STR DUAL SENSOR (WIRE) ×3 IMPLANT
IV NS 1000ML (IV SOLUTION) ×2
IV NS 1000ML BAXH (IV SOLUTION) ×1 IMPLANT
IV NS IRRIG 3000ML ARTHROMATIC (IV SOLUTION) ×3 IMPLANT
MANIFOLD NEPTUNE II (INSTRUMENTS) ×3 IMPLANT
NS IRRIG 1000ML POUR BTL (IV SOLUTION) ×3 IMPLANT
PACK CYSTO (CUSTOM PROCEDURE TRAY) ×3 IMPLANT
SHIELD EYE BINOCULAR (MISCELLANEOUS) IMPLANT
STENT URET 6FRX24 CONTOUR (STENTS) ×3 IMPLANT
SYR CONTROL 10ML LL (SYRINGE) ×3 IMPLANT
TUBE FEEDING 8FR 16IN STR KANG (MISCELLANEOUS) ×3 IMPLANT
TUBING CONNECTING 10 (TUBING) ×2 IMPLANT
TUBING CONNECTING 10' (TUBING) ×1

## 2014-12-30 NOTE — Anesthesia Preprocedure Evaluation (Addendum)
Anesthesia Evaluation  Patient identified by MRN, date of birth, ID band Patient awake    Reviewed: Allergy & Precautions, NPO status , Patient's Chart, lab work & pertinent test results  Airway Mallampati: III  TM Distance: >3 FB Neck ROM: Full    Dental  (+) Teeth Intact   Pulmonary neg pulmonary ROS,  breath sounds clear to auscultation        Cardiovascular hypertension, Pt. on medications Rhythm:Regular Rate:Normal     Neuro/Psych negative neurological ROS     GI/Hepatic negative GI ROS, Neg liver ROS,   Endo/Other  negative endocrine ROS  Renal/GU ARFRenal disease     Musculoskeletal   Abdominal   Peds  Hematology negative hematology ROS (+)   Anesthesia Other Findings   Reproductive/Obstetrics                            Anesthesia Physical Anesthesia Plan  ASA: II  Anesthesia Plan: General   Post-op Pain Management:    Induction: Intravenous  Airway Management Planned: LMA  Additional Equipment:   Intra-op Plan:   Post-operative Plan: Extubation in OR  Informed Consent: I have reviewed the patients History and Physical, chart, labs and discussed the procedure including the risks, benefits and alternatives for the proposed anesthesia with the patient or authorized representative who has indicated his/her understanding and acceptance.     Plan Discussed with: CRNA  Anesthesia Plan Comments:         Anesthesia Quick Evaluation

## 2014-12-30 NOTE — H&P (Signed)
Jeremiah [name] is an 34 y.o. male.    Chief Complaint: Right Ureteral Stone, Acute Renal Failure, Atrophic Left Kidney  HPI:   1- Right Ureteral Stone - >1cm conglomerate of Rt distal ureteral stones by CT 3/14 on eval colicky Rt flank pain with hydro. Contralateral lower pole mild of calcium stones as well.  2 -  Acute Renal Failure - basleine Cr <1. Cr 2.2 noted at [facility] office 3/30.  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   Today Jeremiah [name] is seen for admission and right ureteral stent placement for obstruction of his functionally dominant kidney. No interval high grade fevers, though low grade temp noted today.   Past Medical History  Diagnosis Date  . Hypertension   . History of kidney stones 12/2013  . Nephrolithiasis     BILATERAL --- PER CT  03-13-2014     Past Surgical History  Procedure Laterality Date  . Extracorporeal shock wave lithotripsy Right 02-02-2014  . Cysto/  left retrograde pyelogram/  left ureteral stent placement  10-17-2010  . Nephrolithotomy Left 11-21-2010    History reviewed. No pertinent family history. Social History:  reports that he has never smoked. He has never used smokeless tobacco. He reports that he does not drink alcohol or use illicit drugs.  Allergies:  Allergies  Allergen Reactions  . No Known Allergies     Medications Prior to Admission  Medication Sig Dispense Refill  . acetaminophen (TYLENOL) 500 MG tablet Take 1,000 mg by mouth every 6 (six) hours as needed for mild pain.    . hydrochlorothiazide (HYDRODIURIL) 25 MG tablet Take 1 tablet (25 mg total) by mouth daily. 90 tablet 0  . HYDROcodone-acetaminophen (NORCO/VICODIN) 5-325 MG per tablet Take 1-2 tablets by mouth every 6 (six) hours as needed. (Patient not taking: Reported on 12/30/2014) 15 tablet 0  . ibuprofen (ADVIL,MOTRIN) 600 MG tablet Take 1 tablet (600 mg total) by mouth every 8 (eight) hours as needed. 15 tablet 0   . ondansetron (ZOFRAN ODT) 8 MG disintegrating tablet Take 1 tablet (8 mg total) by mouth every 8 (eight) hours as needed for nausea or vomiting. 10 tablet 0  . ondansetron (ZOFRAN) 4 MG tablet Take 1 tablet (4 mg total) by mouth every 6 (six) hours. 12 tablet 0  . oxyCODONE-acetaminophen (PERCOCET/ROXICET) 5-325 MG per tablet Take 1 tablet by mouth every 4 (four) hours as needed for severe pain. 20 tablet 0  . tamsulosin (FLOMAX) 0.4 MG CAPS capsule Take 1 capsule (0.4 mg total) by mouth daily. 10 capsule 0    Results for orders placed or performed during the hospital encounter of 12/30/14 (from the past 48 hour(s))  Basic metabolic panel     Status: Abnormal   Collection Time: 12/30/14  2:36 PM  Result Value Ref Range   Sodium 137 135 - 145 mmol/L   Potassium 3.6 3.5 - 5.1 mmol/L   Chloride 99 96 - 112 mmol/L   CO2 28 19 - 32 mmol/L   Glucose, Bld 96 70 - 99 mg/dL   BUN 37 (H) 6 - 23 mg/dL   Creatinine, Ser 2.19 (H) 0.50 - 1.35 mg/dL   Calcium 9.6 8.4 - 10.5 mg/dL   GFR calc non Af Amer 37 (L) >90 mL/min   GFR calc Af Amer 43 (L) >90 mL/min    Comment: (NOTE) The eGFR has been calculated using the CKD EPI equation. This  calculation has not been validated in all clinical situations. eGFR's persistently <90 mL/min signify possible Chronic Kidney Disease.    Anion gap 10 5 - 15   No results found.  Review of Systems  Constitutional: Positive for malaise/fatigue.  HENT: Negative.   Eyes: Negative.   Respiratory: Negative.   Cardiovascular: Negative.   Gastrointestinal: Negative.   Genitourinary: Positive for flank pain.  Musculoskeletal: Negative.   Skin: Negative.   Neurological: Negative.   Endo/Heme/Allergies: Negative.   Psychiatric/Behavioral: Negative.     Blood pressure 153/94, pulse 74, temperature 99.4 F (37.4 C), temperature source Oral, resp. rate 16, height _0  (1.6 m), weight 70.081 kg (154 lb 8 oz), SpO2 100 %. Physical Exam  Constitutional: He appears  well-developed.  HENT:  Head: Normocephalic.  Eyes: Pupils are equal, round, and reactive to light.  Neck: Normal range of motion.  Cardiovascular: Normal rate.   Respiratory: Effort normal.  GI: Soft.  Genitourinary: Penis normal.  Moderate Rt CVAT  Musculoskeletal: Normal range of motion.  Neurological: He is alert.  Skin: Skin is warm.  Psychiatric: He has a normal mood and affect. His behavior is normal. Judgment and thought content normal.     Assessment/Plan  1- Right Ureteral Stone - Proceed with rt ureteral stone today. Risks, benefits, alternatives discussed via help with interpreter.   2 -  Acute Renal Failure - IV hydration and serial GFR monitoring in addition to stent placement today;   3-  Atrophic Left Kidney - as per above.   Coulter Oldaker 12/30/2014, 3:42 PM

## 2014-12-30 NOTE — Transfer of Care (Signed)
Immediate Anesthesia Transfer of Care Note  Patient: Jeremiah HeightKun [name]  Procedure(s) Performed: Procedure(s) (LRB): CYSTOSCOPY WITH RIGHT RETROGRADE PYELOGRAM/ RIGHT URETERAL STENT PLACEMENT (Right)  Patient Location: PACU  Anesthesia Type: General  Level of Consciousness: sedated, patient cooperative and responds to stimulation  Airway & Oxygen Therapy: Patient Spontanous Breathing and Patient connected to face mask oxgen  Post-op Assessment: Report given to PACU RN and Post -op Vital signs reviewed and stable  Post vital signs: Reviewed and stable  Complications: No apparent anesthesia complications

## 2014-12-30 NOTE — Anesthesia Postprocedure Evaluation (Signed)
  Anesthesia Post-op Note  Patient: Gus HeightKun Feldstein  Procedure(s) Performed: Procedure(s): CYSTOSCOPY WITH RIGHT RETROGRADE PYELOGRAM/ RIGHT URETERAL STENT PLACEMENT (Right)  Patient Location: PACU  Anesthesia Type:General  Level of Consciousness: awake and alert   Airway and Oxygen Therapy: Patient Spontanous Breathing  Post-op Pain: none  Post-op Assessment: Post-op Vital signs reviewed  Post-op Vital Signs: Reviewed  Last Vitals:  Filed Vitals:   12/30/14 1752  BP: 149/92  Pulse: 76  Temp: 36.6 C  Resp: 13    Complications: No apparent anesthesia complications

## 2014-12-30 NOTE — Anesthesia Procedure Notes (Signed)
Procedure Name: LMA Insertion Date/Time: 12/30/2014 4:33 PM Performed by: Paris LoreBLANTON, Tennyson Wacha M Pre-anesthesia Checklist: Patient identified, Emergency Drugs available, Suction available, Patient being monitored and Timeout performed Patient Re-evaluated:Patient Re-evaluated prior to inductionOxygen Delivery Method: Circle system utilized Preoxygenation: Pre-oxygenation with 100% oxygen Intubation Type: IV induction Ventilation: Mask ventilation without difficulty LMA: LMA inserted LMA Size: 3.0 Number of attempts: 1 Placement Confirmation: positive ETCO2 and breath sounds checked- equal and bilateral Tube secured with: Tape

## 2014-12-30 NOTE — Progress Notes (Addendum)
If patient needs a translator call Vergie LivingHjiem Ayun (cousin) [phone] Wife does not speak English Patient speaks Counselling psychologistMontagnard

## 2014-12-30 NOTE — Brief Op Note (Signed)
12/30/2014  4:52 PM  PATIENT:  Jeremiah HeightKun [name]  34 y.o. male  PRE-OPERATIVE DIAGNOSIS:  right ureteral stone   POST-OPERATIVE DIAGNOSIS:  * No post-op diagnosis entered *  PROCEDURE:  Procedure(s): CYSTOSCOPY WITH RETROGRADE PYELOGRAM/URETERAL STENT PLACEMENT (Right)  SURGEON:  Surgeon(s) and Role:    * Sebastian Acheheodore Bralynn Donado, MD - Primary  PHYSICIAN ASSISTANT:   ASSISTANTS: none   ANESTHESIA:   general  EBL:     BLOOD ADMINISTERED:none  DRAINS: none   LOCAL MEDICATIONS USED:  NONE  SPECIMEN:  No Specimen  DISPOSITION OF SPECIMEN:  N/A  COUNTS:  YES  TOURNIQUET:  * No tourniquets in log *  DICTATION: .Other Dictation: Dictation Number [id number]  PLAN OF CARE: Admit to inpatient   PATIENT DISPOSITION:  PACU - hemodynamically stable.   Delay start of Pharmacological VTE agent (>24hrs) due to surgical blood loss or risk of bleeding: yes

## 2014-12-31 ENCOUNTER — Encounter (HOSPITAL_COMMUNITY): Payer: Self-pay | Admitting: Urology

## 2014-12-31 LAB — BASIC METABOLIC PANEL
Anion gap: 7 (ref 5–15)
BUN: 29 mg/dL — AB (ref 6–23)
CO2: 28 mmol/L (ref 19–32)
CREATININE: 1.79 mg/dL — AB (ref 0.50–1.35)
Calcium: 8.5 mg/dL (ref 8.4–10.5)
Chloride: 103 mmol/L (ref 96–112)
GFR, EST AFRICAN AMERICAN: 55 mL/min — AB (ref >90)
GFR, EST NON AFRICAN AMERICAN: 47 mL/min — AB (ref >90)
Glucose, Bld: 103 mg/dL — ABNORMAL HIGH (ref 70–99)
Potassium: 3.2 mmol/L — ABNORMAL LOW (ref 3.5–5.1)
Sodium: 138 mmol/L (ref 135–145)

## 2014-12-31 MED ORDER — DOCUSATE SODIUM 100 MG PO CAPS: 100.0000 mg | ORAL_CAPSULE | Freq: Two times a day (BID) | ORAL | Status: DC

## 2014-12-31 MED ORDER — CIPROFLOXACIN HCL 250 MG PO TABS: 250.0000 mg | ORAL_TABLET | Freq: Two times a day (BID) | ORAL | Status: DC

## 2014-12-31 MED ORDER — OXYCODONE-ACETAMINOPHEN 5-325 MG PO TABS: 1.0000 | ORAL_TABLET | ORAL | Status: DC | PRN

## 2014-12-31 NOTE — Discharge Summary (Signed)
Physician Discharge Summary  Patient ID: Jeremiah [name] MRN: [id number] DOB/AGE: 1980-12-31 34 y.o.  Admit date: 12/30/2014 Discharge date: 12/31/2014  Admission Diagnoses: Renal failure, acute on chronic, right ureterolithiasis  Discharge Diagnoses:  Active Problems:   Renal failure (ARF), acute on chronic Right Ureterolithiasis  Discharged Condition: good  Hospital Course: The patient underwent right retrograde pyelogram with stent placement on 12/30/2014. They tolerated the procedure well and recovered on the floor without complication. Their diet was advanced to regular. They were ambulatory. Their pain was controlled with PO pain meds. They were voiding without difficulty. His creatinine was trending down. By POD#1 they had met all criteria for discharge. Instructions for follow up were discussed with english-speaking family who were at bedside, particularly with regards to signs/symptoms of infection.   Consults: None  Discharge Exam: Blood pressure 139/89, pulse 65, temperature 97.7 F (36.5 C), temperature source Oral, resp. rate 16, height _0  (1.6 m), weight 70.081 kg (154 lb 8 oz), SpO2 98 %. AAOx3, normal WOB. Abdomen soft, NT/ND. Extremities WWP, no edema   Disposition: 01-Home or Self Care     Medication List    STOP taking these medications        hydrochlorothiazide 25 MG tablet  Commonly known as:  HYDRODIURIL     HYDROcodone-acetaminophen 5-325 MG per tablet  Commonly known as:  NORCO/VICODIN     ibuprofen 200 MG tablet  Commonly known as:  ADVIL,MOTRIN     ibuprofen 600 MG tablet  Commonly known as:  ADVIL,MOTRIN     ondansetron 4 MG tablet  Commonly known as:  ZOFRAN     ondansetron 8 MG disintegrating tablet  Commonly known as:  ZOFRAN ODT     tamsulosin 0.4 MG Caps capsule  Commonly known as:  FLOMAX      TAKE these medications        ciprofloxacin 250 MG tablet  Commonly known as:  CIPRO  Take 1 tablet (250 mg total) by mouth 2 (two) times  daily.     docusate sodium 100 MG capsule  Commonly known as:  COLACE  Take 1 capsule (100 mg total) by mouth 2 (two) times daily.     oxyCODONE-acetaminophen 5-325 MG per tablet  Commonly known as:  ROXICET  Take 1-2 tablets by mouth every 4 (four) hours as needed for severe pain.        Follow up: [facility] (Dr. Risa Grill) on 4/18 as scheduled  Signed: Wynetta Emery, Irva Loser C 12/31/2014, 12:12 PM

## 2014-12-31 NOTE — Op Note (Signed)
NAMAngela [name]:  Cromley, Avid                     ACCOUNT NO.:  [id number]  MEDICAL RECORD NO.:  [id number]  LOCATION:  1439                         [facility]  PHYSICIAN:  Sebastian Acheheodore Flynt Breeze, MD     DATE OF BIRTH:  [birth year]  DATE OF PROCEDURE:  12/30/2014                               OPERATIVE REPORT   DIAGNOSES: 1. Right ureteral stone. 2. Hydronephrosis. 3. Acute renal failure.  PROCEDURE: 1. Cystoscopy with right retrograde pyelogram with interpretation. 2. Insertion of right ureteral stent, 6 x 24, Contour, no tether.  ESTIMATED BLOOD LOSS:  Nil.  COMPLICATIONS:  None.  SPECIMENS:  None.  FINDINGS: 1. Unremarkable urinary bladder. 2. Moderate hydroureteronephrosis on the right with mobile filling     defect in the distal ureter consistent with known stone. 3. Placement of right ureteral stent, proximal in the upper pole,     distal end in the urinary bladder.  INDICATION:  Jeremiah [name] is a pleasant 34 year old  gentleman who speaks very little [place].  Has a long history of nephrolithiasis including prior procedures including shockwave lithotripsy and percutaneous nephrostolithotomy.  He also has an atrophic left kidney. He was found today on workup to have increasing right flank pain to have an enlarging right distal ureteral stone with right hydronephrosis. There was apparent function and concomitant kidney.  Stat creatinine revealed acute renal failure.  It was felt that acute renal decompression was certainly warranted.  Options were discussed including nephrostomy tube versus stent placement and he wished to proceed with the latter.  Informed consent was obtained and placed in the medical record.  PROCEDURE IN DETAIL:  The patient being [name] was verified.  Procedure being cysto and right stent placement was confirmed, procedure carried out.  Time-out was performed.  Intravenous antibiotics administered. General LMA anesthesia was introduced.  The patient was placed  into a low lithotomy position.  Sterile field was created prepping and draping the patient's penis, perineum, and proximal thighs using iodine x3. Next, cystourethroscopy was performed using a 22-French rigid cystoscope with 30-degree offset lens.  Inspection of the anterior and posterior urethra unremarkable.  Inspection of the urinary bladder revealed no diverticula, calcifications, or papular lesions.  Ureteral orifices were singleton bilaterally.  The right ureteral orifice was cannulated with a 6-French end-hole catheter and right retrograde pyelogram was obtained.  Right retrograde pyelogram demonstrated a single right ureter with single system right kidney.  There was moderate hydroureteronephrosis to the level of distal ureter at which point, there was a mobile filling defect consistent with likely multifocal right distal ureteral stones. A 0.038 Sensor wire was advanced at the level of the upper pole, and the open-ended catheter was exchanged for a new 6 x 24 Contour type stent proximal end upper pole, distal end urinary bladder, and there appeared to be excellent urine flow around and through the distal end of the stent.  Bladder was emptied per cystoscope.  Procedure was then terminated.  The patient tolerated the procedure well.  There were no immediate periprocedural complications.  The patient was taken to the postanesthesia care unit in a stable condition.  Notably, the patient's left side was  not stented, he does have some chronic mild hydronephrosis on this side, however, this is at its baseline compared with imaging over the past several years.          ______________________________ Sebastian Ache, MD     TM/MEDQ  D:  12/30/2014  T:  12/31/2014  Job:  [id number]

## 2014-12-31 NOTE — Discharge Instructions (Signed)
1. You may see some blood in the urine and may have some burning with urination for 48-72 hours. You also may notice that you have to urinate more frequently or urgently after your procedure which is normal.  2. You should call should you develop an inability urinate, fever > 101, persistent nausea and vomiting that prevents you from eating or drinking to stay hydrated.  3. You have a stent. you will likely urinate more frequently and urgently until the stent is removed and you may experience some discomfort/pain in the lower abdomen and flank especially when urinating. You may take pain medication prescribed to you if needed for pain. You may also intermittently have blood in the urine until the stent is removed. It is essential that you follow up for stent removal as instructed. If the stent is left in place, this could result in permanent damage to and even loss of the kidney, as well as other complications like infections and kidney stones.   Call [phone] with any questions. You will follow up with Dr. Isabel CapriceGrapey on 4/18.

## 2015-01-01 LAB — URINE CULTURE
CULTURE: NO GROWTH
Colony Count: NO GROWTH

## 2015-01-12 ENCOUNTER — Other Ambulatory Visit: Payer: Self-pay | Admitting: Urology

## 2015-01-14 ENCOUNTER — Encounter (HOSPITAL_BASED_OUTPATIENT_CLINIC_OR_DEPARTMENT_OTHER): Payer: Self-pay | Admitting: *Deleted

## 2015-01-15 ENCOUNTER — Encounter (HOSPITAL_BASED_OUTPATIENT_CLINIC_OR_DEPARTMENT_OTHER): Payer: Self-pay | Admitting: *Deleted

## 2015-01-15 NOTE — Progress Notes (Signed)
SPOKE W/ PT'S COUSIN-N-LAW, HIJEM AYUN, WHOM INTERPRETED FROM PT AND WILL BE INTERPRETOR DOS.  NPO AFTER MN. ARRIVE AT 0830.  NEEDS HG. CURRENT BMET IN CHART AND EPIC. MAY TAKE OXYCODONE AM DOS W/ SIPS OF WATER.

## 2015-01-20 ENCOUNTER — Ambulatory Visit (HOSPITAL_BASED_OUTPATIENT_CLINIC_OR_DEPARTMENT_OTHER)
Admission: RE | Admit: 2015-01-20 | Discharge: 2015-01-20 | Disposition: A | Discharge status: Home / Self Care | Payer: 59 | Source: Ambulatory Visit | Attending: Urology | Admitting: Urology

## 2015-01-20 ENCOUNTER — Ambulatory Visit (HOSPITAL_BASED_OUTPATIENT_CLINIC_OR_DEPARTMENT_OTHER): Payer: 59 | Admitting: Anesthesiology

## 2015-01-20 ENCOUNTER — Encounter (HOSPITAL_BASED_OUTPATIENT_CLINIC_OR_DEPARTMENT_OTHER): Payer: Self-pay | Admitting: *Deleted

## 2015-01-20 ENCOUNTER — Encounter (HOSPITAL_BASED_OUTPATIENT_CLINIC_OR_DEPARTMENT_OTHER)
Admission: RE | Disposition: A | Discharge status: Home / Self Care | Payer: Self-pay | Source: Ambulatory Visit | Attending: Urology

## 2015-01-20 DIAGNOSIS — I10 Essential (primary) hypertension: Secondary | ICD-10-CM | POA: Insufficient documentation

## 2015-01-20 DIAGNOSIS — N1330 Unspecified hydronephrosis: Secondary | ICD-10-CM | POA: Insufficient documentation

## 2015-01-20 DIAGNOSIS — Z79899 Other long term (current) drug therapy: Secondary | ICD-10-CM | POA: Diagnosis not present

## 2015-01-20 DIAGNOSIS — N201 Calculus of ureter: Secondary | ICD-10-CM

## 2015-01-20 DIAGNOSIS — Z87442 Personal history of urinary calculi: Secondary | ICD-10-CM | POA: Diagnosis not present

## 2015-01-20 DIAGNOSIS — M549 Dorsalgia, unspecified: Secondary | ICD-10-CM | POA: Diagnosis present

## 2015-01-20 HISTORY — DX: Unspecified right bundle-branch block: I45.10

## 2015-01-20 HISTORY — DX: Personal history of other diseases of urinary system: Z87.448

## 2015-01-20 HISTORY — PX: STONE EXTRACTION WITH BASKET: SHX5318

## 2015-01-20 HISTORY — DX: Atrophy of kidney (terminal): N26.1

## 2015-01-20 HISTORY — DX: Calculus of ureter: N20.1

## 2015-01-20 LAB — POCT HEMOGLOBIN-HEMACUE: HEMOGLOBIN: 15.7 g/dL (ref 13.0–17.0)

## 2015-01-20 SURGERY — CYSTOSCOPY/URETEROSCOPY/HOLMIUM LASER/STENT PLACEMENT
Anesthesia: General | Site: Ureter | Laterality: Right

## 2015-01-20 MED ORDER — OXYCODONE HCL 5 MG PO TABS
5.0000 mg | ORAL_TABLET | Freq: Once | ORAL | Status: AC
  Administered 2015-01-20: 5 mg via ORAL
  Filled 2015-01-20: qty 1

## 2015-01-20 MED ORDER — PROMETHAZINE HCL 25 MG/ML IJ SOLN
6.2500 mg | INTRAMUSCULAR | Status: DC | PRN
  Filled 2015-01-20: qty 1

## 2015-01-20 MED ORDER — URELLE 81 MG PO TABS
1.0000 | ORAL_TABLET | Freq: Four times a day (QID) | ORAL | Status: DC
  Administered 2015-01-20: 81 mg via ORAL
  Filled 2015-01-20: qty 1

## 2015-01-20 MED ORDER — ACETAMINOPHEN 10 MG/ML IV SOLN
INTRAVENOUS | Status: DC | PRN
  Administered 2015-01-20: 1000 mg via INTRAVENOUS

## 2015-01-20 MED ORDER — MIDAZOLAM HCL 5 MG/5ML IJ SOLN
INTRAMUSCULAR | Status: DC | PRN
  Administered 2015-01-20: 2 mg via INTRAVENOUS

## 2015-01-20 MED ORDER — ONDANSETRON HCL 4 MG/2ML IJ SOLN
INTRAMUSCULAR | Status: DC | PRN
  Administered 2015-01-20: 4 mg via INTRAVENOUS

## 2015-01-20 MED ORDER — FENTANYL CITRATE (PF) 100 MCG/2ML IJ SOLN
25.0000 ug | INTRAMUSCULAR | Status: DC | PRN
  Filled 2015-01-20: qty 1

## 2015-01-20 MED ORDER — PROPOFOL 10 MG/ML IV BOLUS
INTRAVENOUS | Status: DC | PRN
  Administered 2015-01-20: 200 mg via INTRAVENOUS

## 2015-01-20 MED ORDER — URIBEL 118 MG PO CAPS
1.0000 | ORAL_CAPSULE | Freq: Three times a day (TID) | ORAL | Status: DC | PRN
Start: 2015-01-20 — End: 2018-07-03

## 2015-01-20 MED ORDER — LABETALOL HCL 5 MG/ML IV SOLN
INTRAVENOUS | Status: DC | PRN
  Administered 2015-01-20 (×2): 5 mg via INTRAVENOUS

## 2015-01-20 MED ORDER — MIDAZOLAM HCL 2 MG/2ML IJ SOLN
INTRAMUSCULAR | Status: AC
  Filled 2015-01-20: qty 2

## 2015-01-20 MED ORDER — SODIUM CHLORIDE 0.9 % IR SOLN
Status: DC | PRN
  Administered 2015-01-20: 7000 mL

## 2015-01-20 MED ORDER — FENTANYL CITRATE (PF) 100 MCG/2ML IJ SOLN
INTRAMUSCULAR | Status: AC
  Filled 2015-01-20: qty 4

## 2015-01-20 MED ORDER — KETOROLAC TROMETHAMINE 30 MG/ML IJ SOLN
INTRAMUSCULAR | Status: DC | PRN
  Administered 2015-01-20: 30 mg via INTRAVENOUS

## 2015-01-20 MED ORDER — LACTATED RINGERS IV SOLN
INTRAVENOUS | Status: DC
  Filled 2015-01-20: qty 1000

## 2015-01-20 MED ORDER — MEPERIDINE HCL 25 MG/ML IJ SOLN
6.2500 mg | INTRAMUSCULAR | Status: DC | PRN
  Filled 2015-01-20: qty 1

## 2015-01-20 MED ORDER — CEFAZOLIN SODIUM 1-5 GM-% IV SOLN
1.0000 g | INTRAVENOUS | Status: DC
  Filled 2015-01-20: qty 50

## 2015-01-20 MED ORDER — LACTATED RINGERS IV SOLN
INTRAVENOUS | Status: DC
  Administered 2015-01-20: 10:00:00 via INTRAVENOUS
  Filled 2015-01-20: qty 1000

## 2015-01-20 MED ORDER — CEFAZOLIN SODIUM-DEXTROSE 2-3 GM-% IV SOLR
INTRAVENOUS | Status: AC
  Filled 2015-01-20: qty 50

## 2015-01-20 MED ORDER — OXYCODONE HCL 5 MG PO TABS
ORAL_TABLET | ORAL | Status: AC
  Filled 2015-01-20: qty 1

## 2015-01-20 MED ORDER — OXYCODONE-ACETAMINOPHEN 5-325 MG PO TABS: 1.0000 | ORAL_TABLET | ORAL | Status: DC | PRN

## 2015-01-20 MED ORDER — CEFAZOLIN SODIUM-DEXTROSE 2-3 GM-% IV SOLR
2.0000 g | INTRAVENOUS | Status: AC
  Administered 2015-01-20: 2 g via INTRAVENOUS
  Filled 2015-01-20: qty 50

## 2015-01-20 MED ORDER — URELLE 81 MG PO TABS
ORAL_TABLET | ORAL | Status: AC
  Filled 2015-01-20: qty 1

## 2015-01-20 MED ORDER — DEXAMETHASONE SODIUM PHOSPHATE 4 MG/ML IJ SOLN
INTRAMUSCULAR | Status: DC | PRN
  Administered 2015-01-20: 10 mg via INTRAVENOUS

## 2015-01-20 MED ORDER — LIDOCAINE HCL (CARDIAC) 20 MG/ML IV SOLN
INTRAVENOUS | Status: DC | PRN
  Administered 2015-01-20: 100 mg via INTRAVENOUS

## 2015-01-20 MED ORDER — FENTANYL CITRATE (PF) 100 MCG/2ML IJ SOLN
INTRAMUSCULAR | Status: DC | PRN
  Administered 2015-01-20 (×2): 50 ug via INTRAVENOUS

## 2015-01-20 SURGICAL SUPPLY — 22 items
BAG DRAIN URO-CYSTO SKYTR STRL (DRAIN) ×3 IMPLANT
BASKET ZERO TIP NITINOL 2.4FR (BASKET) ×6 IMPLANT
CANISTER SUCT LVC 12 LTR MEDI- (MISCELLANEOUS) ×3 IMPLANT
CATH INTERMIT  6FR 70CM (CATHETERS) ×3 IMPLANT
CLOTH BEACON ORANGE TIMEOUT ST (SAFETY) ×3 IMPLANT
FIBER LASER FLEXIVA 200 (UROLOGICAL SUPPLIES) ×3 IMPLANT
FIBER LASER FLEXIVA 365 (UROLOGICAL SUPPLIES) IMPLANT
GLOVE BIO SURGEON STRL SZ 6.5 (GLOVE) ×2 IMPLANT
GLOVE BIO SURGEON STRL SZ7.5 (GLOVE) ×3 IMPLANT
GLOVE BIO SURGEONS STRL SZ 6.5 (GLOVE) ×1
GLOVE BIOGEL PI IND STRL 6.5 (GLOVE) ×2 IMPLANT
GLOVE BIOGEL PI INDICATOR 6.5 (GLOVE) ×4
GOWN STRL REUS W/ TWL LRG LVL3 (GOWN DISPOSABLE) ×1 IMPLANT
GOWN STRL REUS W/ TWL XL LVL3 (GOWN DISPOSABLE) ×1 IMPLANT
GOWN STRL REUS W/TWL LRG LVL3 (GOWN DISPOSABLE) ×2
GOWN STRL REUS W/TWL XL LVL3 (GOWN DISPOSABLE) ×2
GUIDEWIRE STR DUAL SENSOR (WIRE) ×3 IMPLANT
IV NS 1000ML (IV SOLUTION) ×4
IV NS 1000ML BAXH (IV SOLUTION) ×2 IMPLANT
IV NS IRRIG 3000ML ARTHROMATIC (IV SOLUTION) ×6 IMPLANT
PACK CYSTO (CUSTOM PROCEDURE TRAY) ×3 IMPLANT
WATER STERILE IRR 500ML POUR (IV SOLUTION) ×3 IMPLANT

## 2015-01-20 NOTE — H&P (Signed)
Reason For Visit Seen today as a work-in for back/groin pain 8/10.   Active Problems Problems  1. Bilateral kidney stones (N20.0)   Assessed By: Jimmey Ralph (Urology); Last Assessed: 30 Dec 2014 2. Flank pain, acute (R10.9)   Assessed By: Jimmey Ralph (Urology); Last Assessed: 30 Dec 2014 3. Microscopic hematuria (R31.2)   Assessed By: Jimmey Ralph (Urology); Last Assessed: 30 Dec 2014 4. Nausea and vomiting (R11.2)   Assessed By: Jimmey Ralph (Urology); Last Assessed: 30 Dec 2014  History of Present Illness 34 YO male patient of Dr Cy Blamer seen today as a work-in for back/groin pain 8/10.    GU hx:  Because of language barrier and lack of a translator is very difficult to determine if he's had much recent pain. He is status post percutaneous nephrolithotomy 3 years ago for a very large dense stone in his left kidney. He essentially developed some nonspecific right flank pain and it was unclear if it was related or not to an 8 mm stone in his lower pole. He underwent ESWL in May of this year but had poor fragmentation. We had talked with him about the possibility of trying ureteroscopy and he was on the schedule for that.    In the interim the patient presented to the emergency room with left-sided abdominal and flank pain. There appeared to be a 3 mm stone in his bladder was suspected that he had passed a small stone in the left kidney. His symptoms were better and therefore the ureteroscopy was canceled. He now presents for follow-up. Again it's a little unclear as to how he's been doing recently. Urinalysis today shows nothing of concern.    March 2016 Interval Hx:   Interpreter states that he developed acute flank pain (unsure L>R) that began this am around 0100. Has had 2 episodes of vomiting. Self treated with Ibuprofen w/o relief. Denies f/c or gross hematuria.   Past Medical History Problems  1. History of Calculus of left ureter (N20.1)  Surgical  History Problems  1. History of Cystoscopy With Insertion Of Ureteral Stent Left 2. History of Lithotripsy 3. History of Percutaneous Lithotomy For Stone Over 2cm. 4. History of Renal Endoscopy Via Nephrostomy With Ureteral Catheterizati  Current Meds 1. HTN Complex CAPS;  Therapy: (Recorded:26May2015) to Recorded  Allergies Medication  1. No Known Drug Allergies  Family History Problems  1. Family history of Family Health Status Number Of Children   2 sons and 2 girls 2. No pertinent family history : Mother, Father  Social History Problems  1. Denied: History of Alcohol Use 2. Denied: History of Caffeine Use 3. Marital History - Currently Married 4. Never A Smoker 5. Denied: History of Tobacco Use  Review of Systems Genitourinary, constitutional, skin, eye, otolaryngeal, hematologic/lymphatic, cardiovascular, pulmonary, endocrine, musculoskeletal, gastrointestinal, neurological and psychiatric system(s) were reviewed and pertinent findings if present are noted and are otherwise negative.  Gastrointestinal: nausea, vomiting, flank pain and abdominal pain.  Musculoskeletal: back pain.    Vitals Vital Signs [Data Includes: Last 1 Day]  Recorded: [id number] 11:11AM  Blood Pressure: 169 / 95 Temperature: 98.36 F Heart Rate: 75  Physical Exam Constitutional: Well nourished and well developed . No acute distress. The patient appears well hydrated.  ENT:. The ears and nose are normal in appearance.  Pulmonary: No respiratory distress.  Cardiovascular: Heart rate and rhythm are normal.  Abdomen: The abdomen is flat. The abdomen is soft and nontender. The abdomen is not firm and not rigid. Moderate tenderness  in the RLQ is present and tenderness in the LUQ is present, but no RUQ tenderness and no LLQ tenderness. moderate bilateral CVA tenderness.  Skin: Normal skin turgor.  Neuro/Psych:. Mood and affect are appropriate.    Results/Data Urine [Data Includes: Last 1 Day]    [id number]  COLOR YELLOW   APPEARANCE CLEAR   SPECIFIC GRAVITY 1.015   pH 6.0   GLUCOSE NEG mg/dL  BILIRUBIN NEG   KETONE NEG mg/dL  BLOOD LARGE   PROTEIN NEG mg/dL  UROBILINOGEN 0.2 mg/dL  NITRITE NEG   LEUKOCYTE ESTERASE TRACE   SQUAMOUS EPITHELIAL/HPF RARE   WBC 0-2 WBC/hpf  RBC 11-20 RBC/hpf  BACTERIA RARE   CRYSTALS NONE SEEN   CASTS NONE SEEN    The following images/tracing/specimen were independently visualized:  CT urogram: shows bilateral hydronephrosis but no obvious left ureteral obstruction. Severe right hydronephrosis and hydroureter to level of distal 12 mm right ureteral calculus. This CT was reviewed by Dr. Tresa Moore.  The following clinical lab reports were reviewed:  UA: significant microscopic hematuria Stat Creatine. which shows a significant increase from baseline. Selected Results  CREATININE with eGFR [id number] 12:12PM Jimmey Ralph  SPECIMEN TYPE: BLOOD   Test Name Result Flag Reference  CREATININE 2.30 mg/dL H 0.50-1.50  Est GFR, African American 41 mL/min L   PERFORMED AT:        [facility]                      [address]                      [place], [place] [address]  Est GFR, NonAfrican American 35 mL/min L   THE ESTIMATED GFR IS A CALCULATION VALID FOR ADULTS (>=71 YEARS OLD) THAT USES THE CKD-EPI ALGORITHM TO ADJUST FOR AGE AND SEX. IT IS   NOT TO BE USED FOR CHILDREN, PREGNANT WOMEN, HOSPITALIZED PATIENTS,    PATIENTS ON DIALYSIS, OR WITH RAPIDLY CHANGING KIDNEY FUNCTION. ACCORDING TO THE NKDEP, EGFR >89 IS NORMAL, 60-89 SHOWS MILD IMPAIRMENT, 30-59 SHOWS MODERATE IMPAIRMENT, 15-29 SHOWS SEVERE IMPAIRMENT AND <15 IS ESRD.   Assessment Assessed  1. Bilateral kidney stones (N20.0) 2. Flank pain, acute (R10.9) 3. Nausea and vomiting (R11.2) 4. Microscopic hematuria (R31.2)  Plan Bilateral kidney stones, Flank pain, acute, Microscopic hematuria, Nausea and vomiting  1. AU CT-STONE PROTOCOL; Status:In Progress - Specimen/Data  Collected;   Done:  [id number] 12:00AM Bilateral kidney stones, Microscopic hematuria  2. Administered: Ketorolac Tromethamine 60 MG/2ML Injection Solution Hydronephrosis  3. CREATININE with eGFR; Status:Complete;   Done: [id number] 12:12PM 4. VENIPUNCTURE; Status:Complete;   Done: [id number] Nausea and vomiting  5. Administered: Promethazine HCl 25 MG/ML Injection Solution  Ketorolac 60 mg IM today  Promethazine 25 mg IM today  Discussed situation with Dr. Tresa Moore. He would like a stat creatine now and will decide from this result wether to proceed with urgent vs elective cystourethroscopy, R RPG, stone extraction, and double J stent placement. Dr. Tresa Moore has reviewed creatine and has discussed with pt and interpreter that pt needs to proceed today for urgent surgical intervention of cysto/stent placement and 23 hr OBS.   Signatures Electronically signed by : Jimmey Ralph, Trey Paula; Dec 30 2014  3:22PM EST

## 2015-01-20 NOTE — OR Nursing (Signed)
Right ureteral stone taking by dr. Isabel CapriceGrapey.

## 2015-01-20 NOTE — Anesthesia Procedure Notes (Signed)
Procedure Name: LMA Insertion Date/Time: 01/20/2015 11:16 AM Performed by: Maris BergerENENNY, Jeremiah [name] Pre-anesthesia Checklist: Patient identified, Emergency Drugs available, Suction available and Patient being monitored Patient Re-evaluated:Patient Re-evaluated prior to inductionOxygen Delivery Method: Circle System Utilized Preoxygenation: Pre-oxygenation with 100% oxygen Intubation Type: IV induction Ventilation: Mask ventilation without difficulty LMA: LMA inserted LMA Size: 4.0 Number of attempts: 1 Airway Equipment and Method: Bite block Placement Confirmation: positive ETCO2 Dental Injury: Teeth and Oropharynx as per pre-operative assessment

## 2015-01-20 NOTE — Op Note (Signed)
Preoperative diagnosis:right ureteral calculus Postoperative diagnosis:same  Procedure:cystoscopy, removal of right double-J stent, ureteroscopy, holmium laser lithotripsy, basketing of multiple stone fragments   Surgeon: Valetta Fulleravid S. Ashlynd Michna M.D.  Anesthesia: Gen.  Indications:patient has a known history of nephrolithiasis with multiple recurrences.  He recently developed significant Right sided flank discomfort and a previously known right renal stone had migrated to the mid ureter.  The stone was 6 mm and resulted in fairly severe high-grade obstruction.  Patient had placement of a double-J stent below my partners on a more urgent basis.  That was performed approximately 2 weeks ago and he presents now for definitive management.     Technique and findings:patient brought the operating room at successful induction of general anesthesia.  He was placed in lithotomy position from the usual manner.  Cystoscopy revealed a right double-J stent in good position.  The stent was partially removed.  We were unable to place a guidewire through the stent and therefore the cystoscope was reinserted and a guidewire was placed along the stent up to the right renal pelvis.  The previous double-J stent was then removed.  The right distal ureter was engaged with the rigid ureteroscope.  A 6-8 mm stone was encountered in the mid ureter.  Stone was placed in a basket and then broken up into about 10 pieces with a holmium laser lithotriptor fiber.  The largest fragments were basket extracted.  There was no evidence of obvious ureteral injury.  We felt that given the fact the stent indwelling for longer than 2 weeks that he would probably do okay without reinsertion of the stent.  The patient's bladder was emptied.  He was brought to PACU in stable condition.

## 2015-01-20 NOTE — Transfer of Care (Signed)
Immediate Anesthesia Transfer of Care Note  Patient: Jeremiah [name]  Procedure(s) Performed: Procedure(s): CYSTOSCOPY/URETEROSCOPY/HOLMIUM LASER/STENT PLACEMENT (Right) STONE EXTRACTION WITH BASKET (Right)  Patient Location: PACU  Anesthesia Type:General  Level of Consciousness: awake and alert   Airway & Oxygen Therapy: Patient Spontanous Breathing and Patient connected to nasal cannula oxygen  Post-op Assessment: Report given to RN  Post vital signs: Reviewed and stable  Last Vitals:  Filed Vitals:   01/20/15 0848  BP: 139/89  Pulse: 72  Temp: 37 C  Resp: 16    Complications: No apparent anesthesia complications

## 2015-01-20 NOTE — Anesthesia Postprocedure Evaluation (Signed)
  Anesthesia Post-op Note  Patient: Jeremiah [name]  Procedure(s) Performed: Procedure(s) (LRB): CYSTOSCOPY/URETEROSCOPY/HOLMIUM LASER/STENT PLACEMENT (Right) STONE EXTRACTION WITH BASKET (Right)  Patient Location: PACU  Anesthesia Type: General  Level of Consciousness: awake and alert   Airway and Oxygen Therapy: Patient Spontanous Breathing  Post-op Pain: mild  Post-op Assessment: Post-op Vital signs reviewed, Patient's Cardiovascular Status Stable, Respiratory Function Stable, Patent Airway and No signs of Nausea or vomiting  Last Vitals:  Filed Vitals:   01/20/15 0848  BP: 139/89  Pulse: 72  Temp: 37 C  Resp: 16    Post-op Vital Signs: stable   Complications: No apparent anesthesia complications

## 2015-01-20 NOTE — Anesthesia Preprocedure Evaluation (Signed)
Anesthesia Evaluation  Patient identified by MRN, date of birth, ID band Patient awake    Reviewed: Allergy & Precautions, NPO status , Patient's Chart, lab work & pertinent test results  Airway Mallampati: III  TM Distance: >3 FB Neck ROM: Full    Dental  (+) Teeth Intact   Pulmonary neg pulmonary ROS,  breath sounds clear to auscultation        Cardiovascular hypertension, Pt. on medications Rhythm:Regular Rate:Normal     Neuro/Psych negative neurological ROS     GI/Hepatic negative GI ROS, Neg liver ROS,   Endo/Other  negative endocrine ROS  Renal/GU ARFRenal disease     Musculoskeletal   Abdominal   Peds  Hematology negative hematology ROS (+)   Anesthesia Other Findings   Reproductive/Obstetrics                             Anesthesia Physical  Anesthesia Plan  ASA: II  Anesthesia Plan: General   Post-op Pain Management:    Induction: Intravenous  Airway Management Planned: LMA  Additional Equipment:   Intra-op Plan:   Post-operative Plan: Extubation in OR  Informed Consent: I have reviewed the patients History and Physical, chart, labs and discussed the procedure including the risks, benefits and alternatives for the proposed anesthesia with the patient or authorized representative who has indicated his/her understanding and acceptance.     Plan Discussed with: CRNA  Anesthesia Plan Comments:         Anesthesia Quick Evaluation

## 2015-01-20 NOTE — Discharge Instructions (Signed)

## 2015-01-21 ENCOUNTER — Encounter (HOSPITAL_BASED_OUTPATIENT_CLINIC_OR_DEPARTMENT_OTHER): Payer: Self-pay | Admitting: Urology

## 2015-04-11 ENCOUNTER — Emergency Department (HOSPITAL_COMMUNITY)
Admission: EM | Admit: 2015-04-11 | Discharge: 2015-04-11 | Disposition: A | Discharge status: Home / Self Care | Payer: 59 | Attending: Emergency Medicine | Admitting: Emergency Medicine

## 2015-04-11 ENCOUNTER — Encounter (HOSPITAL_COMMUNITY): Payer: Self-pay | Admitting: Emergency Medicine

## 2015-04-11 ENCOUNTER — Emergency Department (HOSPITAL_COMMUNITY): Payer: 59

## 2015-04-11 DIAGNOSIS — I10 Essential (primary) hypertension: Secondary | ICD-10-CM | POA: Insufficient documentation

## 2015-04-11 DIAGNOSIS — N201 Calculus of ureter: Secondary | ICD-10-CM | POA: Diagnosis not present

## 2015-04-11 DIAGNOSIS — R109 Unspecified abdominal pain: Secondary | ICD-10-CM | POA: Diagnosis present

## 2015-04-11 LAB — BASIC METABOLIC PANEL
ANION GAP: 8 (ref 5–15)
BUN: 23 mg/dL — ABNORMAL HIGH (ref 6–20)
CALCIUM: 9.1 mg/dL (ref 8.9–10.3)
CO2: 25 mmol/L (ref 22–32)
Chloride: 101 mmol/L (ref 101–111)
Creatinine, Ser: 1.44 mg/dL — ABNORMAL HIGH (ref 0.61–1.24)
GFR calc Af Amer: >60 mL/min (ref >60)
GFR calc non Af Amer: >60 mL/min (ref >60)
Glucose, Bld: 131 mg/dL — ABNORMAL HIGH (ref 65–99)
POTASSIUM: 3.1 mmol/L — AB (ref 3.5–5.1)
Sodium: 134 mmol/L — ABNORMAL LOW (ref 135–145)

## 2015-04-11 LAB — URINALYSIS, ROUTINE W REFLEX MICROSCOPIC
BILIRUBIN URINE: NEGATIVE
Glucose, UA: NEGATIVE mg/dL
Ketones, ur: NEGATIVE mg/dL
Leukocytes, UA: NEGATIVE
Nitrite: NEGATIVE
Protein, ur: 30 mg/dL — AB
Specific Gravity, Urine: 1.017 (ref 1.005–1.030)
Urobilinogen, UA: 0.2 mg/dL (ref 0.0–1.0)
pH: 5.5 (ref 5.0–8.0)

## 2015-04-11 LAB — URINE MICROSCOPIC-ADD ON

## 2015-04-11 MED ORDER — OXYCODONE-ACETAMINOPHEN 10-325 MG PO TABS: 1.0000 | ORAL_TABLET | ORAL | Status: DC | PRN

## 2015-04-11 MED ORDER — SODIUM CHLORIDE 0.9 % IV BOLUS (SEPSIS)
1000.0000 mL | Freq: Once | INTRAVENOUS | Status: AC
  Administered 2015-04-11: 1000 mL via INTRAVENOUS

## 2015-04-11 MED ORDER — HYDROMORPHONE HCL 1 MG/ML IJ SOLN
1.0000 mg | Freq: Once | INTRAMUSCULAR | Status: AC
  Administered 2015-04-11: 1 mg via INTRAVENOUS
  Filled 2015-04-11: qty 1

## 2015-04-11 MED ORDER — TAMSULOSIN HCL 0.4 MG PO CAPS: 0.4000 mg | ORAL_CAPSULE | Freq: Every day | ORAL | Status: DC

## 2015-04-11 MED ORDER — ONDANSETRON HCL 4 MG/2ML IJ SOLN
4.0000 mg | Freq: Once | INTRAMUSCULAR | Status: AC
  Administered 2015-04-11: 4 mg via INTRAVENOUS
  Filled 2015-04-11: qty 2

## 2015-04-11 MED ORDER — TAMSULOSIN HCL 0.4 MG PO CAPS
0.4000 mg | ORAL_CAPSULE | Freq: Once | ORAL | Status: AC
  Administered 2015-04-11: 0.4 mg via ORAL
  Filled 2015-04-11: qty 1

## 2015-04-11 MED ORDER — OXYCODONE-ACETAMINOPHEN 5-325 MG PO TABS
2.0000 | ORAL_TABLET | Freq: Once | ORAL | Status: AC
  Administered 2015-04-11: 2 via ORAL
  Filled 2015-04-11: qty 2

## 2015-04-11 NOTE — ED Provider Notes (Signed)
CSN: [id number]   Arrival date & time 04/11/15 1248  History  This chart was scribed for non-physician practitioner, Teressa Lower NP, working with Eber Hong, MD by Bethel Born, ED Scribe. This patient was seen in room WTR7/WTR7 and the patient's care was started at 1:24 PM.  Chief Complaint  Patient presents with  . Nephrolithiasis    HPI The history is provided by the patient and a relative. No language interpreter was used (Family at the bedside translating occasionally. ).   Jeremiah [name] is a 34 y.o. male with PMHx of nephrolithiasis and renal failure who presents to the Emergency Department complaining of constant pain in the left side of the abdomen with sudden onset today around 10 AM. He had similar pain in the past with a kidney stone. His last kidney stone was "a few months" ago. Associated symptoms include dysuria. Pt denies fever, vomiting, diarrhea, and hematuria.  Past Medical History  Diagnosis Date  . Hypertension   . History of kidney stones 12/2013  . Incomplete right bundle branch block   . History of acute renal failure     ADX 12-30-2014  . Right ureteral stone   . Nephrolithiasis     BILATERAL --- PER CT  03-13-2014   . Atrophy of left kidney     Past Surgical History  Procedure Laterality Date  . Extracorporeal shock wave lithotripsy Right 02-02-2014  . Cysto/  left retrograde pyelogram/  left ureteral stent placement  10-17-2010  . Nephrolithotomy Left 11-21-2010  . Cystoscopy w/ ureteral stent placement Right 12/30/2014    Procedure: CYSTOSCOPY WITH RIGHT RETROGRADE PYELOGRAM/ RIGHT URETERAL STENT PLACEMENT;  Surgeon: Sebastian Ache, MD;  Location: [facility];  Service: Urology;  Laterality: Right;  . Stone extraction with basket Right 01/20/2015    Procedure: STONE EXTRACTION WITH BASKET;  Surgeon: Barron Alvine, MD;  Location: [facility];  Service: Urology;  Laterality: Right;    No family history on file.  History  Substance Use Topics   . Smoking status: Never Smoker   . Smokeless tobacco: Never Used  . Alcohol Use: No     Review of Systems  Constitutional: Negative for fever.  Gastrointestinal: Positive for abdominal pain. Negative for nausea and vomiting.  Genitourinary: Positive for dysuria. Negative for hematuria.  All other systems reviewed and are negative.   Home Medications   Prior to Admission medications   Medication Sig Start Date End Date Taking? Authorizing Provider  oxyCODONE-acetaminophen (ROXICET) 5-325 MG per tablet Take 1-2 tablets by mouth every 4 (four) hours as needed for severe pain. 01/20/15  Yes Barron Alvine, MD  ciprofloxacin (CIPRO) 250 MG tablet Take 1 tablet (250 mg total) by mouth 2 (two) times daily. Patient not taking: Reported on 04/11/2015 12/31/14   Harlene Salts, MD  docusate sodium (COLACE) 100 MG capsule Take 1 capsule (100 mg total) by mouth 2 (two) times daily. Patient not taking: Reported on 04/11/2015 12/31/14   Harlene Salts, MD  Meth-Hyo-M Salley Hews Phos-Ph Sal (URIBEL) 118 MG CAPS Take 1 capsule (118 mg total) by mouth 3 (three) times daily as needed. Patient not taking: Reported on 04/11/2015 01/20/15   Barron Alvine, MD    Allergies  No known allergies  Triage Vitals: BP 149/98 mmHg  Pulse 67  Resp 20  SpO2 100%  Physical Exam  Constitutional: He is oriented to person, place, and time. He appears well-developed and well-nourished. No distress.  HENT:  Head: Normocephalic and atraumatic.  Eyes: Conjunctivae and EOM  are normal.  Neck: Neck supple. No tracheal deviation present.  Cardiovascular: Normal rate.   Pulmonary/Chest: Effort normal and breath sounds normal. No respiratory distress.  Abdominal: Soft. Bowel sounds are normal. There is no tenderness.  Musculoskeletal: Normal range of motion.  Neurological: He is alert and oriented to person, place, and time. He exhibits normal muscle tone. Coordination normal.  Skin: Skin is warm and dry.  Psychiatric: He has a  normal mood and affect. His behavior is normal.  Nursing note and vitals reviewed.   ED Course  Procedures   DIAGNOSTIC STUDIES: Oxygen Saturation is 100% on RA, normal by my interpretation.    COORDINATION OF CARE: 1:30 PM Discussed treatment plan which includes lab work, Dilaudid, Zofran, and IVF with pt at bedside and pt agreed to plan.  4:54 PM-Consult complete with Dr. Nunzio CoryLyons (Urology Resident). Patient case explained and discussed. He recommends Percocet and Flomax. Call ended at 4:57 PM.  Labs Review-  Labs Reviewed  URINALYSIS, ROUTINE W REFLEX MICROSCOPIC (NOT AT [facility]) - Abnormal; Notable for the following:    APPearance CLOUDY (*)    Hgb urine dipstick LARGE (*)    Protein, ur 30 (*)    All other components within normal limits  BASIC METABOLIC PANEL - Abnormal; Notable for the following:    Sodium 134 (*)    Potassium 3.1 (*)    Glucose, Bld 131 (*)    BUN 23 (*)    Creatinine, Ser 1.44 (*)    All other components within normal limits  URINE MICROSCOPIC-ADD ON    Imaging Review Dg Abd 1 View  04/11/2015   CLINICAL DATA:  Flank pain.  History kidney stones  EXAM: ABDOMEN - 1 VIEW  COMPARISON:  [facility]UB 06/19/2014, CT 12/30/2014  FINDINGS: No renal calculi. Left lower pole renal calculi were present on the CT but not well seen on today's study. Phlebolith in the left pelvis is stable  Normal bowel gas pattern.  Normal bony structures.  IMPRESSION: No renal calculi identified.  Normal bowel gas pattern.   Electronically Signed   By: Marlan Palauharles  Clark M.D.   On: 04/11/2015 14:23   Ct Renal Stone Study  04/11/2015   CLINICAL DATA:  Acute left flank pain.  EXAM: CT ABDOMEN AND PELVIS WITHOUT CONTRAST  TECHNIQUE: Multidetector CT imaging of the abdomen and pelvis was performed following the standard protocol without IV contrast.  COMPARISON:  CT scan of December 30, 2014.  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 Visualized lung bases appear normal.  No gallstones are noted. No  focal abnormality is noted in the liver, spleen or pancreas. Adrenal glands appear normal. Nonobstructive calculus is noted in lower pole collecting system of right kidney. No hydronephrosis or renal obstruction is noted on the right. Multiple large calculi are noted in lower pole collecting system of left kidney. Severe left hydroureteronephrosis is noted secondary to at least 2 left calculi at the left ureterovesical junction, with the largest measuring approximately 7 mm. There is no evidence of bowel obstruction. The appendix appears normal. No abnormal fluid collection is noted. Urinary bladder appears normal. No significant adenopathy is noted.  IMPRESSION: Nonobstructive right nephrolithiasis.  Severe left hydroureteronephrosis secondary to at least 2 calculi at the left ureterovesical junction, with the largest measuring approximately 7 mm.   Electronically Signed   By: Lupita RaiderJames  Green Jr, M.D.   On: 04/11/2015 15:43    EKG Interpretation None      MDM   Final diagnoses:  Ureteral stone   Pt is feeling better at this time. Pt doesn't have an infection in urine. Kidney function is up slightly which appears to be pt baseline. Pt to follow up at [facility]  I personally performed the services described in this documentation, which was scribed in my presence. The recorded information has been reviewed and is accurate.      Teressa Lower, NP 04/11/15 1810  Eber Hong, MD 04/12/15 1450

## 2015-04-11 NOTE — ED Notes (Signed)
Patient transported to X-ray 

## 2015-04-11 NOTE — Discharge Instructions (Signed)
Ureteral Colic Ureteral colic is spasm-like pain from the kidney or the ureter. This is often caused by a kidney stone. The pain is caused by the stone trying to get through the tubes that pass your pee. HOME CARE   Drink enough fluids to keep your pee (urine) clear or pale yellow.  Strain all your pee. A strainer will be provided. Keep anything caught in the strainer and bring it to your doctor. The stone causing the pain may be very small.  Only take medicine as told by your doctor.  Follow up with your doctor as told. GET HELP RIGHT AWAY IF:   Pain is not controlled with medicine.  Pain continues or gets worse.  The pain changes and there is chest or belly (abdominal) pain.  You pass out (faint).  You cannot pee.  You keep throwing up (vomiting).  You have a temperature by mouth above 102 F (38.9 C), not controlled by medicine. MAKE SURE YOU:   Understand these instructions.  Will watch this condition.  Will get help right away if you are not doing well or get worse. Document Released: 03/06/2008 Document Revised: 12/11/2011 Document Reviewed: 03/06/2008 ExitCare Patient Information 2015 [employer]. This information is not intended to replace advice given to you by your health care provider. Make sure you discuss any questions you have with your health care provider.  

## 2015-04-11 NOTE — ED Notes (Signed)
Pt brought in from main lobby by family looking for ED. Pt with history of kidney stone with sudden left sided pain today. Denies blood in urine. Pt able to urinate "a little bit". Pain 10/10

## 2016-03-12 IMAGING — CR DG ABDOMEN 1V
1 series · 1 of 1 positions shown · non-contrast
Comparison: DG ABDOMEN 1V dated 01/12/2014;

CLINICAL DATA: Kidney stone.

EXAM:
ABDOMEN - 1 VIEW

[t abdomen supine]
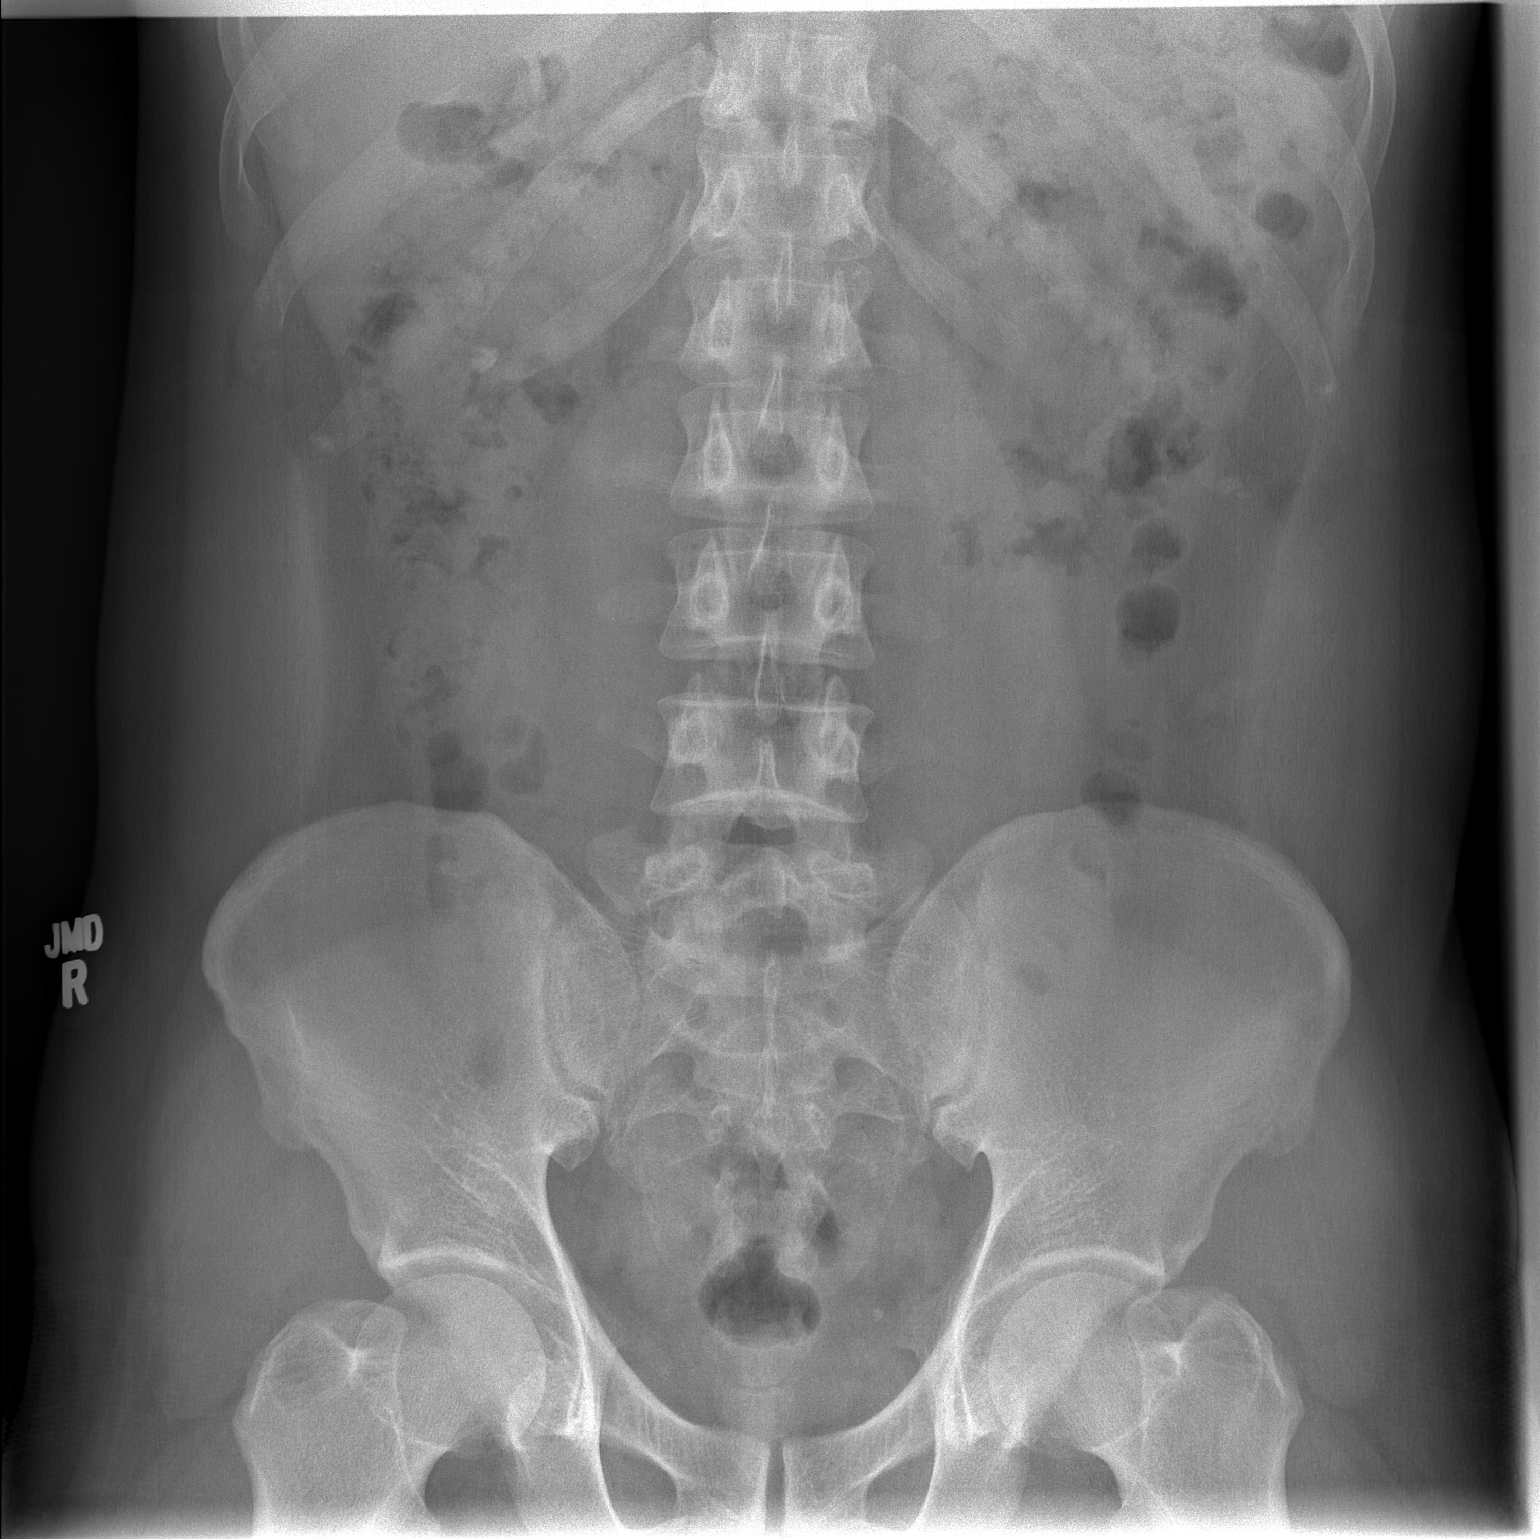

[1 of 1 positions shown; findings below may reference images not displayed]

DG ABDOMEN 1V dated
12/23/2013; DG ABD 1 VIEW dated 11/18/2012; CT ABD/PELV WO CM dated
11/22/2010
FINDINGS: Soft tissue structures are unremarkable. Calcification within the
right upper quadrant remains unchanged in position. This is
consistent with right nephrolithiasis. Stable calcification left
lower pelvis consistent with phlebolith. Gas pattern nonspecific.
Bony structures normal.
IMPRESSION: Stable right nephrolithiasis.

## 2018-07-03 ENCOUNTER — Ambulatory Visit (HOSPITAL_COMMUNITY)
Admission: EM | Admit: 2018-07-03 | Discharge: 2018-07-03 | Disposition: A | Discharge status: Home / Self Care | Payer: 59 | Attending: Emergency Medicine | Admitting: Emergency Medicine

## 2018-07-03 ENCOUNTER — Encounter (HOSPITAL_COMMUNITY): Payer: Self-pay

## 2018-07-03 DIAGNOSIS — R51 Headache: Secondary | ICD-10-CM

## 2018-07-03 DIAGNOSIS — R519 Headache, unspecified: Secondary | ICD-10-CM

## 2018-07-03 DIAGNOSIS — I10 Essential (primary) hypertension: Secondary | ICD-10-CM | POA: Diagnosis not present

## 2018-07-03 LAB — POCT I-STAT, CHEM 8
BUN: 15 mg/dL (ref 6–20)
Calcium, Ion: 1.15 mmol/L (ref 1.15–1.40)
Chloride: 100 mmol/L (ref 98–111)
Creatinine, Ser: 1.3 mg/dL — ABNORMAL HIGH (ref 0.61–1.24)
Glucose, Bld: 103 mg/dL — ABNORMAL HIGH (ref 70–99)
HEMATOCRIT: 47 % (ref 39.0–52.0)
HEMOGLOBIN: 16 g/dL (ref 13.0–17.0)
Potassium: 3.5 mmol/L (ref 3.5–5.1)
SODIUM: 139 mmol/L (ref 135–145)
TCO2: 28 mmol/L (ref 22–32)

## 2018-07-03 MED ORDER — IBUPROFEN 800 MG PO TABS
ORAL_TABLET | ORAL | Status: AC
  Filled 2018-07-03: qty 1

## 2018-07-03 MED ORDER — HYDROCHLOROTHIAZIDE 25 MG PO TABS: 25.0000 mg | ORAL_TABLET | Freq: Every day | ORAL | 0 refills | Status: DC

## 2018-07-03 MED ORDER — IBUPROFEN 800 MG PO TABS
800.0000 mg | ORAL_TABLET | Freq: Once | ORAL | Status: AC
  Administered 2018-07-03: 800 mg via ORAL

## 2018-07-03 NOTE — ED Triage Notes (Signed)
Pt presents with elevated blood pressure. 

## 2018-07-03 NOTE — ED Provider Notes (Signed)
[facility]    CSN: [id number] Arrival date & time: 07/03/18  4098     History   Chief Complaint Chief Complaint  Patient presents with  . Hypertension    HPI Jeremiah [name] is a 37 y.o. male.   Jeremiah [name] presents with concerns for HTn and headache. Noticed approximately 3 days ago. Posterior headache. Checked his BP at the pharmacy and it was in the 150's systolic. No dizziness. Headache 8/10. Feels fatigued. Took tylenol yesterday which did not help. States has been on medications for htn in the past but no longer takes. Does not have a PCP. No chest pain , palpitations, shortness of breath , nausea or vomiting. No diaphoresis. No leg swelling. Doesn't smoke. Hx of kidney stones and htn.   Patient primarily speaks [place], no available audio interpreter available therefore some difficulty with language.   ROS per HPI.      Past Medical History:  Diagnosis Date  . Atrophy of left kidney   . History of acute renal failure    ADX 12-30-2014  . History of kidney stones 12/2013  . Hypertension   . Incomplete right bundle branch block   . Nephrolithiasis    BILATERAL --- PER CT  03-13-2014   . Right ureteral stone     Patient Active Problem List   Diagnosis Date Noted  . Ureteral calculus 01/20/2015  . Renal failure (ARF), acute on chronic (HCC) 12/30/2014    Past Surgical History:  Procedure Laterality Date  . CYSTO/  LEFT RETROGRADE PYELOGRAM/  LEFT URETERAL STENT PLACEMENT  10-17-2010  . CYSTOSCOPY W/ URETERAL STENT PLACEMENT Right 12/30/2014   Procedure: CYSTOSCOPY WITH RIGHT RETROGRADE PYELOGRAM/ RIGHT URETERAL STENT PLACEMENT;  Surgeon: Sebastian Ache, MD;  Location: [facility];  Service: Urology;  Laterality: Right;  . EXTRACORPOREAL SHOCK WAVE LITHOTRIPSY Right 02-02-2014  . NEPHROLITHOTOMY Left 11-21-2010  . STONE EXTRACTION WITH BASKET Right 01/20/2015   Procedure: STONE EXTRACTION WITH BASKET;  Surgeon: Barron Alvine, MD;  Location[facility];   Service: Urology;  Laterality: Right;       Home Medications    Prior to Admission medications   Medication Sig Start Date End Date Taking? Authorizing Provider  hydrochlorothiazide (HYDRODIURIL) 25 MG tablet Take 1 tablet (25 mg total) by mouth daily. 07/03/18   Georgetta Haber, NP  oxyCODONE-acetaminophen (PERCOCET) 10-325 MG per tablet Take 1 tablet by mouth every 4 (four) hours as needed for pain. 04/11/15   Teressa Lower, NP  oxyCODONE-acetaminophen (ROXICET) 5-325 MG per tablet Take 1-2 tablets by mouth every 4 (four) hours as needed for severe pain. 01/20/15   Barron Alvine, MD  tamsulosin (FLOMAX) 0.4 MG CAPS capsule Take 1 capsule (0.4 mg total) by mouth daily. 04/11/15   Teressa Lower, NP    Family History History reviewed. No pertinent family history.  Social History Social History   Tobacco Use  . Smoking status: Never Smoker  . Smokeless tobacco: Never Used  Substance Use Topics  . Alcohol use: No  . Drug use: No     Allergies   No known allergies   Review of Systems Review of Systems   Physical Exam Triage Vital Signs ED Triage Vitals [07/03/18 0844]  Enc Vitals Group     BP (!) 149/86     Pulse Rate 80     Resp 16     Temp 97.9 F (36.6 C)     Temp Source Oral     SpO2 100 %  Weight      Height      Head Circumference      Peak Flow      Pain Score      Pain Loc      Pain Edu?      Excl. in GC?    No data found.  Updated Vital Signs BP (!) 149/86 (BP Location: Right Arm)   Pulse 80   Temp 97.9 F (36.6 C) (Oral)   Resp 16   SpO2 100%    Physical Exam  Constitutional: He is oriented to person, place, and time. He appears well-developed and well-nourished.  Cardiovascular: Normal rate and regular rhythm.  Pulmonary/Chest: Effort normal and breath sounds normal.  Musculoskeletal:  No peripheral edema   Neurological: He is alert and oriented to person, place, and time. No cranial nerve deficit or sensory deficit. He  exhibits normal muscle tone. Coordination normal.  Skin: Skin is warm and dry.     UC Treatments / Results  Labs (all labs ordered are listed, but only abnormal results are displayed) Labs Reviewed  POCT I-STAT, CHEM 8 - Abnormal; Notable for the following components:      Result Value   Creatinine, Ser 1.30 (*)    Glucose, Bld 103 (*)    All other components within normal limits    EKG None  Radiology No results found.  Procedures Procedures (including critical care time)  Medications Ordered in UC Medications  ibuprofen (ADVIL,MOTRIN) tablet 800 mg (800 mg Oral Given 07/03/18 0913)    Initial Impression / Assessment and Plan / UC Course  I have reviewed the triage vital signs and the nursing notes.  Pertinent labs & imaging results that were available during my care of the patient were reviewed by me and considered in my medical decision making (see chart for details).    No acute physical findings, no neurological findings, non toxic appearing.  Improved creatinine since last check, 1.3 today. On recheck during history Bp 153/90. Will restart HCTZ. Encouraged recheck in the next 1-2 weeks. Emphasized and encouraged establish with PCP for recheck and management. Return precautions provided. Patient verbalized understanding and agreeable to plan.    Final Clinical Impressions(s) / UC Diagnoses   Final diagnoses:  Hypertension, unspecified type  Acute nonintractable headache, unspecified headache type     Discharge Instructions     Please monitor your blood pressure at home.  Please call today to establish with a primary care provider for recheck and management of your blood pressure.  If you develop worsening of headache, vision changes, chest pain, palpitations, nausea, vomiting, leg swelling, or otherwise worsening please return to be seen.     ED Prescriptions    Medication Sig Dispense Auth. Provider   hydrochlorothiazide (HYDRODIURIL) 25 MG tablet Take 1  tablet (25 mg total) by mouth daily. 30 tablet Georgetta Haber, NP     Controlled Substance Prescriptions [place] Controlled Substance Registry consulted? Not Applicable   Georgetta Haber, NP 07/03/18 [phone]

## 2018-07-03 NOTE — Discharge Instructions (Signed)
Please monitor your blood pressure at home.  Please call today to establish with a primary care provider for recheck and management of your blood pressure.  If you develop worsening of headache, vision changes, chest pain, palpitations, nausea, vomiting, leg swelling, or otherwise worsening please return to be seen.

## 2020-01-27 ENCOUNTER — Emergency Department (HOSPITAL_COMMUNITY): Payer: 59

## 2020-01-27 ENCOUNTER — Other Ambulatory Visit: Payer: Self-pay

## 2020-01-27 ENCOUNTER — Emergency Department (HOSPITAL_COMMUNITY)
Admission: EM | Admit: 2020-01-27 | Discharge: 2020-01-27 | Disposition: A | Discharge status: Home / Self Care | Payer: 59 | Attending: Emergency Medicine | Admitting: Emergency Medicine

## 2020-01-27 DIAGNOSIS — R109 Unspecified abdominal pain: Secondary | ICD-10-CM

## 2020-01-27 DIAGNOSIS — Z79899 Other long term (current) drug therapy: Secondary | ICD-10-CM | POA: Diagnosis not present

## 2020-01-27 DIAGNOSIS — N200 Calculus of kidney: Secondary | ICD-10-CM | POA: Diagnosis not present

## 2020-01-27 DIAGNOSIS — N1330 Unspecified hydronephrosis: Secondary | ICD-10-CM | POA: Insufficient documentation

## 2020-01-27 DIAGNOSIS — I10 Essential (primary) hypertension: Secondary | ICD-10-CM | POA: Diagnosis not present

## 2020-01-27 LAB — CBC
HCT: 46.9 % (ref 39.0–52.0)
Hemoglobin: 15.8 g/dL (ref 13.0–17.0)
MCH: 24.8 pg — ABNORMAL LOW (ref 26.0–34.0)
MCHC: 33.7 g/dL (ref 30.0–36.0)
MCV: 73.6 fL — ABNORMAL LOW (ref 80.0–100.0)
Platelets: 267 10*3/uL (ref 150–400)
RBC: 6.37 MIL/uL — ABNORMAL HIGH (ref 4.22–5.81)
RDW: 12.9 % (ref 11.5–15.5)
WBC: 5.1 10*3/uL (ref 4.0–10.5)
nRBC: 0 % (ref 0.0–0.2)

## 2020-01-27 LAB — URINALYSIS, ROUTINE W REFLEX MICROSCOPIC
Bilirubin Urine: NEGATIVE
Glucose, UA: NEGATIVE mg/dL
Hgb urine dipstick: NEGATIVE
Ketones, ur: NEGATIVE mg/dL
Leukocytes,Ua: NEGATIVE
Nitrite: NEGATIVE
Protein, ur: NEGATIVE mg/dL
Specific Gravity, Urine: 1.011 (ref 1.005–1.030)
pH: 6 (ref 5.0–8.0)

## 2020-01-27 LAB — BASIC METABOLIC PANEL
Anion gap: 10 (ref 5–15)
BUN: 17 mg/dL (ref 6–20)
CO2: 24 mmol/L (ref 22–32)
Calcium: 8.9 mg/dL (ref 8.9–10.3)
Chloride: 105 mmol/L (ref 98–111)
Creatinine, Ser: 1.16 mg/dL (ref 0.61–1.24)
GFR calc Af Amer: >60 mL/min (ref >60)
GFR calc non Af Amer: >60 mL/min (ref >60)
Glucose, Bld: 112 mg/dL — ABNORMAL HIGH (ref 70–99)
Potassium: 3.4 mmol/L — ABNORMAL LOW (ref 3.5–5.1)
Sodium: 139 mmol/L (ref 135–145)

## 2020-01-27 MED ORDER — TAMSULOSIN HCL 0.4 MG PO CAPS: 0.4000 mg | ORAL_CAPSULE | Freq: Every day | ORAL | 0 refills | Status: AC

## 2020-01-27 MED ORDER — HYDROCODONE-ACETAMINOPHEN 5-325 MG PO TABS
1.0000 | ORAL_TABLET | Freq: Once | ORAL | Status: AC
  Administered 2020-01-27: 1 via ORAL
  Filled 2020-01-27: qty 1

## 2020-01-27 MED ORDER — HYDROCODONE-ACETAMINOPHEN 5-325 MG PO TABS: 1.0000 | ORAL_TABLET | Freq: Four times a day (QID) | ORAL | 0 refills | Status: AC | PRN

## 2020-01-27 MED ORDER — KETOROLAC TROMETHAMINE 30 MG/ML IJ SOLN
15.0000 mg | Freq: Once | INTRAMUSCULAR | Status: AC
  Administered 2020-01-27: 15 mg via INTRAVENOUS
  Filled 2020-01-27: qty 1

## 2020-01-27 NOTE — ED Provider Notes (Signed)
[facility] DEPT Provider Note   CSN: [id number] Arrival date & time: 01/27/20  1027     History Chief Complaint  Patient presents with  . Flank Pain    Jeremiah [name] is a 39 y.o. male.  HPI HPI Comments: Jeremiah [name] is a 39 y.o. male with history of kidney stones who presents to the Emergency Department complaining of 1 week of waxing and waning 7/10 bilateral flank pain. He states his pain is left greater than right. His pain worsens with movement. He states he has had difficulty sleeping due to pain. He also reports associated nausea, diarrhea, 2-3 episodes of hematuria 2 days ago. He is not on any regular medications. He has not taken anything for symptoms. He states his pain is consistent with prior kidney stones. He denies fevers, URI symptoms, chest pain, shortness of breath, abdominal pain, vomiting, constipation, urinary changes, syncope.    Past Medical History:  Diagnosis Date  . Atrophy of left kidney   . History of acute renal failure    ADX 12-30-2014  . History of kidney stones 12/2013  . Hypertension   . Incomplete right bundle branch block   . Nephrolithiasis    BILATERAL --- PER CT  03-13-2014   . Right ureteral stone     Patient Active Problem List   Diagnosis Date Noted  . Ureteral calculus 01/20/2015  . Renal failure (ARF), acute on chronic ([place]) 12/30/2014    Past Surgical History:  Procedure Laterality Date  . CYSTO/  LEFT RETROGRADE PYELOGRAM/  LEFT URETERAL STENT PLACEMENT  10-17-2010  . CYSTOSCOPY W/ URETERAL STENT PLACEMENT Right 12/30/2014   Procedure: CYSTOSCOPY WITH RIGHT RETROGRADE PYELOGRAM/ RIGHT URETERAL STENT PLACEMENT;  Surgeon: Alexis Frock, MD;  Location: [facility];  Service: Urology;  Laterality: Right;  . EXTRACORPOREAL SHOCK WAVE LITHOTRIPSY Right 02-02-2014  . NEPHROLITHOTOMY Left 11-21-2010  . STONE EXTRACTION WITH BASKET Right 01/20/2015   Procedure: STONE EXTRACTION WITH BASKET;  Surgeon: Rana Snare,  MD;  Location: [facility];  Service: Urology;  Laterality: Right;       No family history on file.  Social History   Tobacco Use  . Smoking status: Never Smoker  . Smokeless tobacco: Never Used  Substance Use Topics  . Alcohol use: No  . Drug use: No    Home Medications Prior to Admission medications   Medication Sig Start Date End Date Taking? Authorizing Provider  hydrochlorothiazide (HYDRODIURIL) 25 MG tablet Take 1 tablet (25 mg total) by mouth daily. 07/03/18   Zigmund Gottron, NP  oxyCODONE-acetaminophen (PERCOCET) 10-325 MG per tablet Take 1 tablet by mouth every 4 (four) hours as needed for pain. 04/11/15   Glendell Docker, NP  oxyCODONE-acetaminophen (ROXICET) 5-325 MG per tablet Take 1-2 tablets by mouth every 4 (four) hours as needed for severe pain. 01/20/15   Rana Snare, MD  tamsulosin (FLOMAX) 0.4 MG CAPS capsule Take 1 capsule (0.4 mg total) by mouth daily. 04/11/15   Glendell Docker, NP    Allergies    No known allergies  Review of Systems   Review of Systems  All other systems reviewed and are negative.  Ten systems reviewed and are negative for acute change, except as noted in the HPI.   Physical Exam Updated Vital Signs BP (!) 186/83 (BP Location: Left Arm)   Pulse 80   Temp 98.5 F (36.9 C) (Oral)   Resp 17   Ht 5\' 5"  (1.651 m)   Wt 68  kg   SpO2 100%   BMI 24.96 kg/m   Physical Exam Vitals and nursing note reviewed.  Constitutional:      General: He is in acute distress.     Appearance: Normal appearance. He is not ill-appearing, toxic-appearing or diaphoretic.     Comments: Well-developed adult male sitting upright speaking clearly coherently with his relative who is interpreting. He appears to be in pain.  HENT:     Head: Normocephalic and atraumatic.     Right Ear: External ear normal.     Left Ear: External ear normal.     Nose: Nose normal.     Mouth/Throat:     Mouth: Mucous membranes are moist.     Pharynx:  Oropharynx is clear. No oropharyngeal exudate or posterior oropharyngeal erythema.  Eyes:     General: No scleral icterus.       Right eye: No discharge.        Left eye: No discharge.     Extraocular Movements: Extraocular movements intact.     Pupils: Pupils are equal, round, and reactive to light.  Cardiovascular:     Rate and Rhythm: Normal rate and regular rhythm.     Pulses: Normal pulses.     Heart sounds: Normal heart sounds. No murmur. No friction rub. No gallop.   Pulmonary:     Effort: Pulmonary effort is normal. No respiratory distress.     Breath sounds: Normal breath sounds. No stridor. No wheezing, rhonchi or rales.  Abdominal:     General: Abdomen is flat.     Palpations: Abdomen is soft.     Tenderness: There is no abdominal tenderness. There is right CVA tenderness and left CVA tenderness.     Comments: Bilateral CVA tenderness noted. Left greater than right. Positive Murphy strike test bilaterally.  Musculoskeletal:        General: Normal range of motion.     Cervical back: Normal range of motion and neck supple. No tenderness.  Skin:    General: Skin is warm and dry.  Neurological:     General: No focal deficit present.     Mental Status: He is alert and oriented to person, place, and time.  Psychiatric:        Mood and Affect: Mood normal.        Behavior: Behavior normal.     ED Results / Procedures / Treatments   Labs (all labs ordered are listed, but only abnormal results are displayed) Labs Reviewed  CBC - Abnormal; Notable for the following components:      Result Value   RBC 6.37 (*)    MCV 73.6 (*)    MCH 24.8 (*)    All other components within normal limits  BASIC METABOLIC PANEL - Abnormal; Notable for the following components:   Potassium 3.4 (*)    Glucose, Bld 112 (*)    All other components within normal limits  URINALYSIS, ROUTINE W REFLEX MICROSCOPIC    EKG None  Radiology US Renal  Result Date: 01/27/2020 CLINICAL DATA:   Flank pain EXAM: RENAL / URINARY TRACT ULTRASOUND COMPLETE COMPARISON:  05/06/2015 FINDINGS: Right Kidney: Renal measurements: 11.6 x 4.4 x 4.8 cm. = volume: 129 mL. No mass lesion or hydronephrosis is noted. Previously seen small renal calculi are not well appreciated on today's exam. Left Kidney: Renal measurements: 13.5 x 5.6 x 5.9 cm. = volume: 234 mL. Mild hydronephrosis is noted similar to that seen on the prior CT examination. There  is a 1.8 cm stone identified in the lower pole of the left kidney. Multiple smaller nonobstructing stones are noted. Bladder: Appears normal for degree of bladder distention. Other: None. IMPRESSION: Chronic hydronephrosis is noted on the left similar to that seen on the prior CT examination. Multiple nonobstructing renal calculi are seen on the left. The largest of these lies in the lower pole measuring 1.8 cm. Normal-appearing right kidney without evidence of calculi. Previously seen small lower pole stones are not well appreciated on today's exam. Electronically Signed   By: Alcide Clever M.D.   On: 01/27/2020 15:10   CT Renal Stone Study  Result Date: 01/27/2020 CLINICAL DATA:  Left flank pain for 1 week. History of nephrolithiasis EXAM: CT ABDOMEN AND PELVIS WITHOUT CONTRAST TECHNIQUE: Multidetector CT imaging of the abdomen and pelvis was performed following the standard protocol without IV contrast. COMPARISON:  05/06/2015 FINDINGS: Lower chest: No acute abnormality. Hepatobiliary: No focal liver abnormality is seen. No gallstones, gallbladder wall thickening, or biliary dilatation. Pancreas: Unremarkable. No pancreatic ductal dilatation or surrounding inflammatory changes. Spleen: Normal in size without focal abnormality. Adrenals/Urinary Tract: Unremarkable adrenal glands. Multiple nonobstructing left renal calculi with 2 stones in the inferior pole measuring up to 6 mm. Left-sided pelviectasis without hydronephrosis, similar in appearance to prior exam. Left renal  cortical atrophy. No left ureteral calculi. Unremarkable appearance of the right kidney. No right renal or ureteral calculi. Urinary bladder is unremarkable. Stomach/Bowel: Stomach is within normal limits. Calcified appendicolith within a nondilated, noninflamed appearing appendix (series 2, image 62). No evidence of bowel wall thickening, distention, or inflammatory changes. Vascular/Lymphatic: No significant vascular findings are present. No enlarged abdominal or pelvic lymph nodes. Reproductive: Prostate is unremarkable. Other: No abdominal wall hernia or abnormality. No abdominopelvic ascites. Musculoskeletal: Chronic bilateral L5 pars interarticularis defects. No acute osseous findings. IMPRESSION: 1. No acute abdominopelvic findings. 2. Multiple nonobstructing left renal calculi measuring up to 6 mm. Chronic left-sided pelviectasis without hydronephrosis. Left renal cortical atrophy is again seen. 3. Calcified appendicolith within a nondilated, noninflamed appendix. 4. Chronic bilateral L5 pars interarticularis defects. Electronically Signed   By: Duanne Guess D.O.   On: 01/27/2020 16:30   Procedures Procedures  Medications Ordered in ED Medications  HYDROcodone-acetaminophen (NORCO/VICODIN) 5-325 MG per tablet 1 tablet (1 tablet Oral Given 01/27/20 1407)  ketorolac (TORADOL) 30 MG/ML injection 15 mg (15 mg Intravenous Given 01/27/20 1408)   ED Course  I have reviewed the triage vital signs and the nursing notes.  Pertinent labs & imaging results that were available during my care of the patient were reviewed by me and considered in my medical decision making (see chart for details).    MDM Rules/Calculators/A&P                      1:48 PM patient is a pleasant 39 year old male with a history of nephrolithiasis who presents with symptoms consistent with prior nephrolithiasis.  Per records, patient has a history of cystoscopy and stone removal in 2016.  Labs are generally reassuring. No  RBCs or hemoglobin on the UA. Patient given a Norco for pain as well as 15 mg Toradol. Will obtain a ultrasound renal and will reassess.  3:56 PM patient states his pain is well controlled.  Ultrasound of the kidneys shows multiple nonobstructing renal calculi on the left side with the largest of these being 1.8 cm and lies in the lower pole.  There is normal-appearing right kidney without any evidence of calculi.  Due to the size of the stone will obtain CT renal stone study.  4:39 PM CT renal stone study shows multiple nonobstructing left renal calculi measuring up to 6 mm.  Chronic left-sided pelviectasis without hydronephrosis.  Left renal cortical atrophy is seen once again. I discussed this with my attending physician Dr. Derwood Kaplan. Will discharge patient on flomax and a short course of norco. I recommended patient follow-up with his urologist Dr. Isabel Caprice as soon as possible regarding this visit.  Strict return precautions given and he understands to return to the emergency department any new or worsening symptoms.  His questions were answered and is amicable to time of discharge.  Vital signs stable.  Patient discharged to home/self care.  Condition at discharge: Stable  Note: Portions of this report may have been transcribed using voice recognition software. Every effort was made to ensure accuracy; however, inadvertent computerized transcription errors may be present.    Final Clinical Impression(s) / ED Diagnoses Final diagnoses:  Right flank pain  Left flank pain  Kidney stone   Rx / DC Orders ED Discharge Orders         Ordered    tamsulosin (FLOMAX) 0.4 MG CAPS capsule  Daily after breakfast     01/27/20 1648    HYDROcodone-acetaminophen (NORCO/VICODIN) 5-325 MG tablet  Every 6 hours PRN     01/27/20 1648           Placido Sou, PA-C 01/27/20 1655    Derwood Kaplan, MD 01/28/20 0715

## 2020-01-27 NOTE — ED Triage Notes (Signed)
Patient reports flank pain left side starting 1 week ago. Pain is 8/10, patient is unable to sleep at night due to pain. Patient has history of kidney stones.

## 2020-01-27 NOTE — ED Notes (Signed)
Patient is complaining of left and right lower back pain. Patient thinks that his kidney stones is bothering him.

## 2020-01-27 NOTE — Discharge Instructions (Addendum)
Per our discussion, I am prescribing you 2 new medications.  I am prescribing you Flomax please take this once per day in the morning which will increase frequency of urination and hopefully help you pass the stone quicker.  I am also prescribing you a short course of Vicodin which is a stronger narcotic you can take for breakthrough pain as needed.  I would recommend taking ibuprofen for management of your regular pain.  This can be a constipating medication so please be sure to eat an adequate amount of fiber and stay hydrated.  You can also take a stool softener if you find it necessary.  Please do not hesitate to return the emergency department with any new or worsening symptoms.  Please follow-up with urology regarding this visit.  It was a pleasure to meet you.

## 2020-06-24 ENCOUNTER — Encounter (HOSPITAL_COMMUNITY): Payer: Self-pay | Admitting: Emergency Medicine

## 2020-06-24 ENCOUNTER — Other Ambulatory Visit: Payer: Self-pay

## 2020-06-24 ENCOUNTER — Ambulatory Visit (HOSPITAL_COMMUNITY)
Admission: EM | Admit: 2020-06-24 | Discharge: 2020-06-24 | Disposition: A | Discharge status: Home / Self Care | Payer: 59 | Attending: Family Medicine | Admitting: Family Medicine

## 2020-06-24 DIAGNOSIS — H60392 Other infective otitis externa, left ear: Secondary | ICD-10-CM | POA: Diagnosis not present

## 2020-06-24 DIAGNOSIS — R030 Elevated blood-pressure reading, without diagnosis of hypertension: Secondary | ICD-10-CM | POA: Diagnosis not present

## 2020-06-24 MED ORDER — AMLODIPINE BESYLATE 5 MG PO TABS: 5.0000 mg | ORAL_TABLET | Freq: Every day | ORAL | 0 refills | Status: DC

## 2020-06-24 MED ORDER — AMOXICILLIN-POT CLAVULANATE 875-125 MG PO TABS: 1.0000 | ORAL_TABLET | Freq: Two times a day (BID) | ORAL | 0 refills | Status: DC

## 2020-06-24 NOTE — ED Triage Notes (Signed)
Pt c/o left ear pain onset Monday. Pt states he is having some trouble hearing. Pt states he also has some jaw pain preventing him from eating. He states the pain kept him awake last night.

## 2020-06-24 NOTE — Discharge Instructions (Addendum)
[facility] [address], [address] Floor-[facility], [place], [place] [address] [phone] 

## 2020-06-24 NOTE — ED Provider Notes (Signed)
[facility]    CSN: [id number] Arrival date & time: 06/24/20  1430      History   Chief Complaint Chief Complaint  Patient presents with  . Otalgia    HPI Jeremiah [name] is a 39 y.o. male.   Patient presenting today with several days of left outer ear pain, warmth that has been getting worse over time. Denies fever, chills, drainage, headache, injury or head trauma, recent swimming. Tried some peroxide in the ear earlier this morning without relief but otherwise has not tried anything for sxs.   Also very concerned about his persistently elevated BPs the past few years. He does not check these at home but states they are high when he does go to the doctor. He denies HAs, dizziness, blurry vision, or other associated sxs. Per record review was on HCTZ for a period of time but this was about 2 years ago. Does not have a PCP.      Past Medical History:  Diagnosis Date  . Atrophy of left kidney   . History of acute renal failure    ADX 12-30-2014  . History of kidney stones 12/2013  . Hypertension   . Incomplete right bundle branch block   . Nephrolithiasis    BILATERAL --- PER CT  03-13-2014   . Right ureteral stone     Patient Active Problem List   Diagnosis Date Noted  . Ureteral calculus 01/20/2015  . Renal failure (ARF), acute on chronic ([facility]) 12/30/2014    Past Surgical History:  Procedure Laterality Date  . CYSTO/  LEFT RETROGRADE PYELOGRAM/  LEFT URETERAL STENT PLACEMENT  10-17-2010  . CYSTOSCOPY W/ URETERAL STENT PLACEMENT Right 12/30/2014   Procedure: CYSTOSCOPY WITH RIGHT RETROGRADE PYELOGRAM/ RIGHT URETERAL STENT PLACEMENT;  Surgeon: Sebastian Ache, MD;  Location: [facility];  Service: Urology;  Laterality: Right;  . EXTRACORPOREAL SHOCK WAVE LITHOTRIPSY Right 02-02-2014  . NEPHROLITHOTOMY Left 11-21-2010  . STONE EXTRACTION WITH BASKET Right 01/20/2015   Procedure: STONE EXTRACTION WITH BASKET;  Surgeon: Barron Alvine, MD;  Location: [facility];  Service: Urology;  Laterality: Right;       Home Medications    Prior to Admission medications   Medication Sig Start Date End Date Taking? Authorizing Provider  amLODipine (NORVASC) 5 MG tablet Take 1 tablet (5 mg total) by mouth daily. 06/24/20   Particia Nearing, PA-C  amoxicillin-clavulanate (AUGMENTIN) 875-125 MG tablet Take 1 tablet by mouth every 12 (twelve) hours. 06/24/20   Particia Nearing, PA-C  hydrochlorothiazide (HYDRODIURIL) 25 MG tablet Take 1 tablet (25 mg total) by mouth daily. 07/03/18   Georgetta Haber, NP  oxyCODONE-acetaminophen (PERCOCET) 10-325 MG per tablet Take 1 tablet by mouth every 4 (four) hours as needed for pain. 04/11/15   Teressa Lower, NP  oxyCODONE-acetaminophen (ROXICET) 5-325 MG per tablet Take 1-2 tablets by mouth every 4 (four) hours as needed for severe pain. 01/20/15   Barron Alvine, MD    Family History History reviewed. No pertinent family history.  Social History Social History   Tobacco Use  . Smoking status: Never Smoker  . Smokeless tobacco: Never Used  Substance Use Topics  . Alcohol use: No  . Drug use: No     Allergies   No known allergies   Review of Systems Review of Systems PER HPI    Physical Exam Triage Vital Signs ED Triage Vitals [06/24/20 1605]  Enc Vitals Group     BP (!) 192/106  Pulse Rate 100     Resp 16     Temp 99.3 F (37.4 C)     Temp Source Oral     SpO2 100 %     Weight      Height      Head Circumference      Peak Flow      Pain Score 9     Pain Loc      Pain Edu?      Excl. in GC?    No data found.  Updated Vital Signs BP (!) 183/101 (BP Location: Left Arm)   Pulse 100   Temp 99.3 F (37.4 C) (Oral)   Resp 16   SpO2 100%   Visual Acuity Right Eye Distance:   Left Eye Distance:   Bilateral Distance:    Right Eye Near:   Left Eye Near:    Bilateral Near:     Physical Exam Vitals and nursing note reviewed.  Constitutional:      Appearance:  Normal appearance.  HENT:     Head: Atraumatic.     Right Ear: Tympanic membrane and ear canal normal.     Left Ear: Tympanic membrane normal.     Ears:     Comments: Left outer ear and EAC edematous and warm. EAC erythematous and quite narrow from edema. Entire area moderately ttp      Nose: Nose normal.     Mouth/Throat:     Mouth: Mucous membranes are moist.     Pharynx: Oropharynx is clear. No oropharyngeal exudate.  Eyes:     Extraocular Movements: Extraocular movements intact.     Conjunctiva/sclera: Conjunctivae normal.  Cardiovascular:     Rate and Rhythm: Normal rate and regular rhythm.  Pulmonary:     Effort: Pulmonary effort is normal.     Breath sounds: Normal breath sounds.  Abdominal:     General: Bowel sounds are normal. There is no distension.     Palpations: Abdomen is soft.     Tenderness: There is no abdominal tenderness. There is no guarding.  Musculoskeletal:        General: Normal range of motion.     Cervical back: Normal range of motion and neck supple.  Skin:    General: Skin is warm and dry.  Neurological:     General: No focal deficit present.     Mental Status: He is oriented to person, place, and time.  Psychiatric:        Mood and Affect: Mood normal.        Thought Content: Thought content normal.        Judgment: Judgment normal.    UC Treatments / Results  Labs (all labs ordered are listed, but only abnormal results are displayed) Labs Reviewed - No data to display  EKG   Radiology No results found.  Procedures Procedures (including critical care time)  Medications Ordered in UC Medications - No data to display  Initial Impression / Assessment and Plan / UC Course  I have reviewed the triage vital signs and the nursing notes.  Pertinent labs & imaging results that were available during my care of the patient were reviewed by me and considered in my medical decision making (see chart for details).     Otitis externa -  left  Given diffuse nature of infection within left ear will tx with oral augmentin. Avoid qtips or other materials into the ear that may cause further irritation.   Will  start low dose amlodipine and have him check BPs at pharmacy until he can establish with a PCP for follow up and repeat labs. Discussed DASH diet, exercise, stress control for increased control. Side effects and precautions of the medication reviewed with return precautions given.    Final Clinical Impressions(s) / UC Diagnoses   Final diagnoses:  Other infective acute otitis externa of left ear  Elevated blood pressure reading     Discharge Instructions     [facility] [address] [place], [address]qu[address] [facility], [place], [place] [address] [phone]    ED Prescriptions    Medication Sig Dispense Auth. Provider   amLODipine (NORVASC) 5 MG tablet Take 1 tablet (5 mg total) by mouth daily. 30 tablet Particia Nearing, [place]   amoxicillin-clavulanate (AUGMENTIN) 875-125 MG tablet Take 1 tablet by mouth every 12 (twelve) hours. 14 tablet Particia Nearing, [place]     PDMP not reviewed this encounter.   Particia Nearing, [place] 06/24/20 2052

## 2020-07-07 ENCOUNTER — Encounter: Payer: Self-pay | Admitting: Internal Medicine

## 2020-07-07 ENCOUNTER — Other Ambulatory Visit: Payer: Self-pay

## 2020-07-07 ENCOUNTER — Ambulatory Visit (INDEPENDENT_AMBULATORY_CARE_PROVIDER_SITE_OTHER): Payer: 59 | Admitting: Internal Medicine

## 2020-07-07 VITALS — BP 144/78 | HR 89 | Temp 98.6°F | Ht 65.0 in | Wt 156.6 lb

## 2020-07-07 DIAGNOSIS — H60312 Diffuse otitis externa, left ear: Secondary | ICD-10-CM

## 2020-07-07 DIAGNOSIS — I10 Essential (primary) hypertension: Secondary | ICD-10-CM | POA: Diagnosis not present

## 2020-07-07 DIAGNOSIS — Z114 Encounter for screening for human immunodeficiency virus [HIV]: Secondary | ICD-10-CM

## 2020-07-07 LAB — POCT GLYCOSYLATED HEMOGLOBIN (HGB A1C): Hemoglobin A1C: 5.3 % (ref 4.0–5.6)

## 2020-07-07 LAB — GLUCOSE, CAPILLARY: Glucose-Capillary: 92 mg/dL (ref 70–99)

## 2020-07-07 MED ORDER — CIPROFLOXACIN-DEXAMETHASONE 0.3-0.1 % OT SUSP: 4.0000 [drp] | Freq: Two times a day (BID) | OTIC | 0 refills | Status: DC

## 2020-07-07 MED ORDER — AMLODIPINE BESYLATE 5 MG PO TABS: 5.0000 mg | ORAL_TABLET | Freq: Every day | ORAL | 1 refills | Status: DC

## 2020-07-07 MED ORDER — HYDROCHLOROTHIAZIDE 25 MG PO TABS: 25.0000 mg | ORAL_TABLET | Freq: Every day | ORAL | 1 refills | Status: DC

## 2020-07-07 NOTE — Progress Notes (Signed)
CC: Ear pain  HPI: [name] is a 39 y.o. with PMH listed below presenting with complaint of ear pain. Please see problem based assessment and plan for further details.  Past Medical History:  Diagnosis Date  . Atrophy of left kidney   . History of acute renal failure    ADX 12-30-2014  . History of kidney stones 12/2013  . Hypertension   . Incomplete right bundle branch block   . Nephrolithiasis    BILATERAL --- PER CT  03-13-2014   . Right ureteral stone    Family History No clinically relevant family history provided by patient  Social History Lives with wife and children. Works at [employer]. Denies any alcohol, tobacco, illicit drug use  Review of Systems: Review of Systems  Constitutional: Negative for chills, fever and malaise/fatigue.  HENT: Positive for ear discharge and ear pain. Negative for congestion, hearing loss and tinnitus.   Eyes: Negative for blurred vision.  Respiratory: Negative for shortness of breath.   Cardiovascular: Negative for chest pain, palpitations and leg swelling.  Gastrointestinal: Negative for diarrhea, nausea and vomiting.    Physical Exam: Vitals:   07/07/20 1007 07/07/20 1046  BP: (!) 169/92 (!) 144/78  Pulse: 89   Temp: 98.6 F (37 C)   TempSrc: Oral   SpO2: 100%   Weight: 156 lb 9.6 oz (71 kg)   Height: 5\' 5"  (1.651 m)    Gen: Well-developed, well nourished, NAD HEENT: NCAT head, L external ear canal with significant erythema, edema and tenderness without drainage. Unable to visualize tympanic membrane, but hearing intact. R ear wnl. CV: RRR, S1, S2 normal Pulm: CTAB, No rales, no wheezes Extm: ROM intact, Peripheral pulses intact, No peripheral edema Skin: Dry, Warm, normal turgor  Assessment & Plan:   Screening for HIV (human immunodeficiency virus) Agree to get screening labs for HIV, Hep C  Hypertension BP Readings from Last 3 Encounters:  07/07/20 (!) 144/78  06/24/20 (!) 183/101  01/27/20 (!) 168/100    Above goal. Currently on amlodipine 5mg . Mentions that he has a bp cuff at home and has not noticed any significant improvement with amlodipine although he was previously on HCTZ and that appears to have controlled his bp. Denies any chest pain, headaches, blurry vision. - Advised on DASH diet - C/w amlodipine 5mg  - Start hydrochlorothiazide 25mg  daily - Plan to add Acei or Arb if continues to be hypertensive - F/u in 3 weeks for bp check and bmp  Acute diffuse otitis externa of left ear [name] is a Montagnard speaking 39 yo M w/ PMH of HTN presenting to [facility] to establish care. His chief complaints is ear pain. He mentions that he was in his usual state of health until 2 weeks ago when he had pain in his ear without any obvious inciting event. Denies any contact with water, ear trauma, use of q-tips. He mentions pain gradually worsened and had been keeping him up at night so he went to the ED for evaluation. He was diagnosed with otitis externa and was treated w/ 7 day course of Augmentin. On day 2 of his Augmentin, he noticed some clear drainage from his left ear but otherwise his symptoms improved and he finished his course on 07/02/20. Since then, his ear pain has begun to come back and is gradually worsening.  A/P On exam, significant erythema edema of ear canal consistent with otitis externa noted. No systemic symptoms. Vitals stable. Will treat w/ cipro drops. Also  need screening for HIV/Diabetes as prolonged otitis externa infections are quite unusual without exposure. - Ciprodex  - HIV, Hgb a1c - Return to clinic if worsening sxs    Patient discussed with Dr. Oswaldo Done  -Judeth Cornfield, PGY3 [facility] Pager: [phone]

## 2020-07-07 NOTE — Patient Instructions (Addendum)
Thank you for allowing [place] to provide your care today. Today we discussed your ear pain    I have ordered hiv, hep c, hemoglobin a1c labs for you. I will call if any are abnormal.    Today we made the following changes to your medications.    Please use cipro ear drops 4 times daily  Please follow-up in 3 weeks.    Should you have any questions or concerns please call the internal medicine clinic at [phone].    Otitis Externa  Otitis externa is an infection of the outer ear canal. The outer ear canal is the area between the outside of the ear and the eardrum. Otitis externa is sometimes called swimmer's ear. What are the causes? Common causes of this condition include:  Swimming in dirty water.  Moisture in the ear.  An injury to the inside of the ear.  An object stuck in the ear.  A cut or scrape on the outside of the ear. What increases the risk? You are more likely to get this condition if you go swimming often. What are the signs or symptoms?  Itching in the ear. This is often the first symptom.  Swelling of the ear.  Redness in the ear.  Ear pain. The pain may get worse when you pull on your ear.  Pus coming from the ear. How is this treated? This condition may be treated with:  Antibiotic ear drops. These are often given for 10-14 days.  Medicines to reduce itching and swelling. Follow these instructions at home:  If you were given antibiotic ear drops, use them as told by your doctor. Do not stop using them even if your condition gets better.  Take over-the-counter and prescription medicines only as told by your doctor.  Avoid getting water in your ears as told by your doctor. You may be told to avoid swimming or water sports for a few days.  Keep all follow-up visits as told by your doctor. This is important. How is this prevented?  Keep your ears dry. Use the corner of a towel to dry your ears after you swim or bathe.  Try not to scratch or put  things in your ear. Doing these things makes it easier for germs to grow in your ear.  Avoid swimming in lakes, dirty water, or pools that may not have the right amount of a chemical called chlorine. Contact a doctor if:  You have a fever.  Your ear is still red, swollen, or painful after 3 days.  You still have pus coming from your ear after 3 days.  Your redness, swelling, or pain gets worse.  You have a really bad headache.  You have redness, swelling, pain, or tenderness behind your ear. Summary  Otitis externa is an infection of the outer ear canal.  Symptoms include pain, redness, and swelling of the ear.  If you were given antibiotic ear drops, use them as told by your doctor. Do not stop using them even if your condition gets better.  Try not to scratch or put things in your ear. This information is not intended to replace advice given to you by your health care provider. Make sure you discuss any questions you have with your health care provider. Document Revised: 02/22/2018 Document Reviewed: 02/22/2018 Elsevier Patient Education  2020 [employer].

## 2020-07-08 ENCOUNTER — Encounter: Payer: Self-pay | Admitting: Internal Medicine

## 2020-07-08 DIAGNOSIS — Z114 Encounter for screening for human immunodeficiency virus [HIV]: Secondary | ICD-10-CM | POA: Insufficient documentation

## 2020-07-08 DIAGNOSIS — H60312 Diffuse otitis externa, left ear: Secondary | ICD-10-CM | POA: Insufficient documentation

## 2020-07-08 DIAGNOSIS — I10 Essential (primary) hypertension: Secondary | ICD-10-CM | POA: Insufficient documentation

## 2020-07-08 LAB — HIV ANTIBODY (ROUTINE TESTING W REFLEX): HIV Screen 4th Generation wRfx: NONREACTIVE

## 2020-07-08 LAB — HEPATITIS C ANTIBODY: Hep C Virus Ab: <0.1 s/co ratio (ref 0.0–0.9)

## 2020-07-08 NOTE — Assessment & Plan Note (Addendum)
Jeremiah [name] is a [place] speaking 39 yo M w/ PMH of HTN presenting to [facility] to establish care. His chief complaints is ear pain. He mentions that he was in his usual state of health until 2 weeks ago when he had pain in his ear without any obvious inciting event. Denies any contact with water, ear trauma, use of q-tips. He mentions pain gradually worsened and had been keeping him up at night so he went to the ED for evaluation. He was diagnosed with otitis externa and was treated w/ 7 day course of Augmentin. On day 2 of his Augmentin, he noticed some clear drainage from his left ear but otherwise his symptoms improved and he finished his course on 07/02/20. Since then, his ear pain has begun to come back and is gradually worsening.  A/P On exam, significant erythema edema of ear canal consistent with otitis externa noted. No systemic symptoms. Vitals stable. Will treat w/ cipro drops. Also need screening for HIV/Diabetes as prolonged otitis externa infections are quite unusual without exposure. - Ciprodex  - HIV, Hgb a1c - Return to clinic if worsening sxs

## 2020-07-08 NOTE — Assessment & Plan Note (Addendum)
BP Readings from Last 3 Encounters:  07/07/20 (!) 144/78  06/24/20 (!) 183/101  01/27/20 (!) 168/100   Above goal. Currently on amlodipine 5mg . Mentions that he has a bp cuff at home and has not noticed any significant improvement with amlodipine although he was previously on HCTZ and that appears to have controlled his bp. Denies any chest pain, headaches, blurry vision. - Advised on DASH diet - C/w amlodipine 5mg  - Start hydrochlorothiazide 25mg  daily - Plan to add Acei or Arb if continues to be hypertensive - F/u in 3 weeks for bp check and bmp

## 2020-07-08 NOTE — Assessment & Plan Note (Addendum)
Agree to get screening labs for HIV, Hep C

## 2020-07-09 NOTE — Progress Notes (Signed)
Internal Medicine Clinic Attending  Case discussed with Dr. Lee  At the time of the visit.  We reviewed the resident's history and exam and pertinent patient test results.  I agree with the assessment, diagnosis, and plan of care documented in the resident's note.    

## 2020-07-29 ENCOUNTER — Ambulatory Visit (INDEPENDENT_AMBULATORY_CARE_PROVIDER_SITE_OTHER): Payer: 59 | Admitting: Internal Medicine

## 2020-07-29 ENCOUNTER — Other Ambulatory Visit: Payer: Self-pay

## 2020-07-29 ENCOUNTER — Encounter: Payer: Self-pay | Admitting: Internal Medicine

## 2020-07-29 DIAGNOSIS — I10 Essential (primary) hypertension: Secondary | ICD-10-CM | POA: Diagnosis not present

## 2020-07-29 MED ORDER — HYDROCHLOROTHIAZIDE 25 MG PO TABS: 25.0000 mg | ORAL_TABLET | Freq: Every day | ORAL | 3 refills | Status: DC

## 2020-07-29 MED ORDER — AMLODIPINE BESYLATE 5 MG PO TABS: 5.0000 mg | ORAL_TABLET | Freq: Every day | ORAL | 3 refills | Status: DC

## 2020-07-29 NOTE — Patient Instructions (Addendum)
Thank you for allowing us to provide your care today. Today we discussed your blood pressure    I have ordered bmp labs for you. I will call if any are abnormal.    Today we made no changes to your medications.    Please follow-up in 6 months.    Should you have any questions or concerns please call the internal medicine clinic at [phone].     Ch??ng trnh ?n u?ng DASH DASH Eating Plan DASH l vi?t t?t c?a "Dietary Approaches to Stop Hypertension", ngh?a l Ph??ng php ti?p c?n ch? ?? ?n u?ng ?? ng?n ch?n t?ng huy?t p. Ch??ng trnh ?n u?ng DASH l ch??ng trnh ?n u?ng lnh m?nh ? ???c ch?ng minh c tc d?ng lm gi?m huy?t p cao (t?ng huy?t p). N c?ng c th? lm gi?m nguy c? b? ti?u d??ng tup 2, b?nh tim v ??t qu?. Ch??ng trnh ?n u?ng DASH c?ng c th? gip gi?m cn. C nh?ng l?i khuyn no ?? lm theo ch??ng trnh ny?  H??ng d?n chung  Trnh ?n trn 2.300 mg (miligam) mu?i (natri) m?i ngy. N?u b? t?ng huy?t p, qu v? c th? c?n gi?m l??ng dng natri xu?ng ??n m?c 1.500 mg m?i ngy.  Gi?i h?n l??ng r??u qu v? u?ng khng qu 1 ly m?i ngy v?i ph? n? khng mang thai v 2 ly m?i ngy v?i nam gi?i. M?t ly t??ng ???ng v?i 12 ao-x? bia, 5 ao-x? r??u vang, ho?c 1 ao-x? r??u m?nh.  H?p tc v?i chuyn gia ch?m [place] s?c kh?e c?a qu v? ?? duy tr tr?ng l??ng c? th? c l?i cho s?c kh?e ho?c ?? gi?m cn. Hy h?i xem tr?ng l??ng no l l t??ng cho qu v?.  Dnh t nh?t 30 pht t?p th? d?c c th? khi?n tim qu v? ??p nhanh h?n (t?p th? d?c nh?p ?i?u) h?u h?t cc ngy trong tu?n. Cc ho?t ??ng c th? bao g?m ?i b?, b?i, ho?c ??p xe.  H?p tc v?i chuyn gia ch?m [place] s?c kh?e ho?c chuyn gia ch? ?? ?n v dinh d??ng (bc s? chuyn khoa dinh d??ng) c?a qu v? ?? ?i?u ch?nh ch??ng trnh ?n u?ng ph h?p v?i nhu c?u calo c?a c nhn qu v?. ??c nhn th?c ph?m   Ki?m tra nhn th?c ph?m ?? xem hm l??ng natri cho m?i kh?u ph?n. Ch?n nh?ng th?c ph?m c d??i 5 ph?n tr?m L??ng natri hng ngy. Ni  chung, nh?ng th?c ph?m c d??i 300 mg natri cho m?i kh?u ph?n ph h?p v?i ch??ng trnh ?n u?ng ny.  ?? tm cc lo?i ng? c?c nguyn h?t, hy ki?m t? "nguyn h?t", l t? ??u tin trong danh sch thnh ph?n. Mua s?m  Mua cc s?n ph?m dn nhn "natri th?p" ho?c "khng thm mu?i."  Mua th?c ph?m t??i. Trnh nh?ng th?c ph?m ?ng h?p v cc mn ?n ch? bi?n s?n ho?c ?ng l?nh. N?u n??ng  Trnh thm mu?i khi n?u ?n. S? d?ng gia v? khng co? mu?i ho?c th?o d??c thay v mu?i ?n ho?c mu?i bi?n. Ki?m tra v?i chuyn gia ch?m [place] s?c kh?e ho??c d???c sy? c?a quy? vi? tr??c khi s? d?ng cc s?n ph?m thay th? mu?i.  Khng chin/rn ?? ?n. N?u ?? ?n b?ng nh?ng ph??ng php c l?i cho s?c kh?e nh? b? l, lu?c, n??ng v hun nng.  N?u ?n b?ng d?u t?t cho tim, ch?ng h?n nh? d?u  liu, c?i d?u, ??u nnh, ho?c h??ng d??ng. Ln k? ho?ch  cho b?a ?n  ?n ch? ?? ?n lnh m?nh bao g?m: ? T? 5 kh?u ph?n tri cy v rau tr? ln m?i ngy. Vo m?i b?a ?n, c? g?ng dnh m?t n?a ??a cho tri cy v rau. ? T?i ?a 6-8 kh?u ph?n ng? c?c nguyn h?t m?i ngy. ? D??i 6 ao-x? th?t n?c, th?t gia c?m, ho?c c m?i ngy. M?t kh?u ph?n 3 ao-x? th?t t??ng ???ng kch th??c c?a m?t b? bi. M?t qu? tr?ng t??ng ???ng 1 ao-x?. ? 2 kh?u ph?n s?a t bo m?i ngy. ? M?t kh?u ph?n qu? h?ch, cc lo?i h?t, ho?c ??u 5 l?n m?i tu?n. ? Ch?t bo t?t cho tim. Nh?ng ch?t bo lnh m?nh c tn l axit bo Omega-3, ???c tm th?y trong cc th?c ph?m nh? h?t lanh v c n??c l?nh, nh? c mi, c h?i v c thu.  H?n ch? l??ng ?n nh?ng th?c ph?m sau ?y: ? Th?c ph?m ?ng h?p ho?c ?ng gi s?n. ? Th?c ph?m giu ch?t bo chuy?n ha , ch?ng h?n nh? th?c ph?m chin/rn. ? Th?c ph?m giu ch?t bo bo ha, ch?ng h?n nh? th?t m?. ? ?? ng?t, cc mn trng mi?ng, ?? u?ng c ???ng v nh?ng th?c ph?m khc c b? sung ???ng. ? S?n ph?m t? s?a nguyn ch?t bo.  Khng thm mu?i vo th?c ph?m tr??c khi ?n.  C? g?ng ?n t?i thi?u 2 b?a chay m?i tu?n.  ?n nhi?u th?c ?n n?u  t?i nh h?n v i?t th??c ?n ?? nh hng, th??c ?n t? ch?n v th?c ?n nhanh h?n.  Khi ?n ? nh hng, hy yu c?u th??c ?n c?a qu v? ???c n?u nha?t ho?c khng c mu?i, n?u c th?. Nh?ng lo?i th?c ?n no ???c khuy?n ngh?? Nh?ng m?c ???c li?t k c th? khng ph?i danh sch ??y ??. Hy trao ??i v?i bc s? chuyn khoa dinh d??ng v? cc l?a ch?n ch? ?? dinh d??ng no ph h?p nh?t v?i qu v?. Ng? c?c Bnh m nguyn h?t ho?c nguyn cm. M ?ng nguyn h?t ho?c nguyn cm. Ga?o l??t. B?t y?n m?ch. H?t dim m?ch (quinoa). T?m la m (bulgur). Ng? c?c nguyn h?t v ng? c?c t natri. Bnh m pita. Bnh quy gin t bo, t natri. Bnh ng nguyn cm. Derrek Gu c? Derrek Gu c? t??i ho?c ?ng l?nh (s?ng, h?p, bo? lo? ho?c n??ng). N???c p c chua v n??c rau p i?t natri ho??c gi?m natri. N??c x?t c chua va? h?n h?p c chua nho i?t natri ho??c gi?m natri. Rau ?o?ng h?p i?t natri ho??c gia?m natri. Tri cy T?t c? tri cy t??i, kh, ho?c ?ng l?nh. Tri cy ?ng h?p d??i d?ng n??c p t? nhin (khng c b? sung ???ng). Th?t v cc th?c ph?m c protein khc Ga? ho??c ga? ty bo? da. G ho?c g ty xay. Th?t heo l?c m?. C v h?i s?n. Lng tr?ng tr?ng. Cc lo?i ??u, ??u H Lan ho??c ??u l?ng kh. Qu? h?ch, b? h?t v cc lo?i h?t khng mu?i. ??u ?ng h?p khng ??p mu?i. Mi?ng th?t b n?c ? l?c m?. Th?t deli n?c, t natri. S?a S?a t bo (1%) ho?c khng bo (tch bo). Ph mt khng bo, t bo, ho?c gi?m bo. Ph mt s?a g?n kem ho?c ph mt ricotta t natri, khng bo. S?a chua t bo ho?c khng bo. Ph mt t bo, t natri. M? v d?u B? th?c v?t m?m khng c ch?t bo chuy?n ha. D?u th?c v?t. N??c x?t  mayonnaise v n??c tr?n sa lt t bo, gi?m bo, ho?c lo?i nh? (gi?m natri). D?u h?t c?i d?u, d?u rum, d?u  liu, d?u ??u nnh v d?u h??ng d??ng. Qu? b?. Gia v? v cc th?c ph?m khc. Th?o d??c. Gia v?. H?n h?p gia v? khng c mu?i. B?ng ng va? ba?nh quy khng mu?i. ?? ng?t khng bo. Nh?ng lo?i th?c ?n no khng ???c  khuy?n ngh?? Nh?ng m?c ???c li?t k c th? khng ph?i danh sch ??y ??. Hy trao ??i v?i bc s? chuyn khoa dinh d??ng v? cc l?a ch?n ch? ?? dinh d??ng no ph h?p nh?t v?i qu v?. Ng? c?c Cc lo?i bnh n??ng c ch?t bo, ch?ng h?n bnh s?ng b, bnh n??ng x?p, ho?c m?t s? lo?i bnh m. M ?ng kh ho?c cc ti b?t g?o. Derrek Gu c? Derrek Gu c? tr?n kem ho??c xa?o. Cc lo?i rau tr?n n??c x?t pho mt. Rau ?o?ng h?p thng th??ng (khng ph?i lo?i t natri ho??c gia?m natri). N??c x?t c chua ?ng h?p va? h?n h?p c chua nho thng th??ng (khng ph?i lo?i i?t natri ho??c gi?m natri). N???c p rau v c chua thng th??ng (khng ph?i lo?i i?t natri ho??c gi?m natri). D?a chua.  liu. Tri cy Tri cy ?ng h?p ngm xi-r loa?ng ho??c ???c. Tri cy kh. Tri cy ngm trong kem ho?c n??c x?t b?. Th?t v cc th?c ph?m c protein khc Cc la?t thi?t m??. X??ng s??n. Th?t kh. Th?t l?n mu?i xng khi. Xc xch. Cc lo?i th?t hun khi v th?t h?p ch? bi?n s?n khc. Xalami. M? l?ng. Xc xch nng. Mn xc xch l?n ?? rn. Qu? h?ch v cc lo?i h?t ??p mu?i. ??u ?ng h?p c b? sung mu?i. C ?ng h?p ho?c hun khi. Nguyn qu? tr?ng ho?c lng ?? tr?ng. G ho?c g ty cn da. S?a S??a nguyn kem ho??c 2%, kem v n?a n? n?a kia. Ph mt nguyn kem ho?c ph mt kem nguyn ch?t bo. S??a chua nguyn ch?t bo ho?c s?a chua co? ????ng. Pho mt nguyn ch?t bo. B?t kem khng s?a. L??p phu? kem ?a? ?a?nh bng. Pho mt ch? bi?n s?n v pho mt ph?t. M? v d?u B?. B? th?c v?t d?ng th?i. M? l?n. M? tr?u (shortening). B? s??a tru. M?? thi?t xng kho?i. D?u nhi?t ??i, ch?ng h?n nh? d?u d?a, d?u h?t c?, ho?c d?u c?. Gia v? v cc th?c ph?m khc. B?ng ng v bnh quy m??n. Mu?i hnh, mu?i t?i, mu?i nm, mu?i ?n v mu?i bi?n. N???c x?t [place]. N???c x?t tartar. N??c x?t th?t quay. N???c x?t [place]iyaki. N??c t??ng, bao g?m loa?i gi?m natri. N??c x?t th?t n??ng. N???c thi?t ?ng h?p v ?ng gi. N??c m?m. D?u ho. N??c x?t  cocktail. Cy c?i ng?a quy? vi? th?y trn k? ha?ng. N??c x?t c chua. M t?t. H??ng li?u th?t v ch?t la?m m?m thi?t. Tho?i b?t canh. N??t x?t nng v n??c x?t [place]. N??c x?t marinat ch? bi?n s?n ho?c ?ng gi. Gia v? taco ch? bi?n s?n ho?c ?ng gi. ?? gia v?. N???c tr?n sa la?t thng th???ng. N?i ?? tm thm thng tin:  Vi?n Tim, Ph?i v Mu Qu?c gia: [email or web address]  Hi?p H?i [employer] K?: www.heart.org Tm t?t  Ch??ng trnh ?n u?ng DASH l ch??ng trnh ?n u?ng lnh m?nh ? ???c ch?ng minh c tc d?ng lm gi?m huy?t p cao (t?ng huy?t p). N c?ng c th? lm gi?m nguy c? b? ti?u d??ng tup 2, b?nh tim v ??t  qu?.  V?i ch??ng trnh ?n u?ng DASH, qu v? c?n ph?i gi?i h?n l??ng mu?i (natri) ? m?c 2.300 mg m?t ngy. N?u b? t?ng huy?t p, qu v? c th? c?n gi?m l??ng dng natri xu?ng ??n m?c 1.500 mg m?i ngy.  Khi ?ang th?c hi?n theo ch??ng trnh ?n u?ng DASH, m?c ?ch l ?n nhi?u tri cy v rau c? t??i, ng? c?c nguyn h?t, protein th?t n?c, s?a t bo v ch?t bo t?t cho tim h?n.  H?p tc v?i chuyn gia ch?m [place] s?c kh?e ho?c chuyn gia ch? ?? ?n v dinh d??ng (bc s? chuyn khoa dinh d??ng) c?a qu v? ?? ?i?u ch?nh ch??ng trnh ?n u?ng ph h?p v?i nhu c?u calo c?a c nhn qu v?. Thng tin ny khng nh?m m?c ?ch thay th? cho l?i khuyn m chuyn gia ch?m [place] s?c kh?e ni v?i qu v?. Hy b?o ??m qu v? ph?i th?o lu?n b?t k? v?n ?? g m qu v? c v?i chuyn gia ch?m [place] s?c kh?e c?a qu v?. Document Revised: 01/18/2017 Document Reviewed: 01/18/2017 Elsevier Patient Education  2020 [employer].

## 2020-07-29 NOTE — Progress Notes (Signed)
   CC: hypertension  HPI: [name] is a 39 y.o. with PMH listed below presenting with complaint of hypertension. Please see problem based assessment and plan for further details.  Past Medical History:  Diagnosis Date  . Atrophy of left kidney   . History of acute renal failure    ADX 12-30-2014  . History of kidney stones 12/2013  . Hypertension   . Incomplete right bundle branch block   . Nephrolithiasis    BILATERAL --- PER CT  03-13-2014   . Right ureteral stone     Review of Systems: Review of Systems  Constitutional: Negative for chills, fever and malaise/fatigue.  Eyes: Negative for blurred vision.  Respiratory: Negative for cough and shortness of breath.   Cardiovascular: Negative for chest pain, palpitations and leg swelling.  Gastrointestinal: Negative for constipation, diarrhea, nausea and vomiting.  All other systems reviewed and are negative.    Physical Exam: Vitals:   07/29/20 0928 07/29/20 0955  BP: (!) 146/86 126/82  Pulse: 77   Temp: 98.5 F (36.9 C)   SpO2: 100%   Weight: 158 lb 11.2 oz (72 kg)   Height: 5\' 3"  (1.6 m)     Gen: Well-developed, well nourished, NAD HEENT: NCAT head, hearing intact, External auditory canal with minimal swelling CV: RRR, S1, S2 normal Pulm: CTAB, No rales, no wheezes Extm: ROM intact, Peripheral pulses intact, No peripheral edema Skin: Dry, Warm, normal turgor, no wounds, no rashes, no lesions  Assessment & Plan:   Hypertension BP Readings from Last 3 Encounters:  07/29/20 126/82  07/07/20 (!) 144/78  06/24/20 (!) 183/101   [name] is a 39 yo M w/ PMH of HTN presenting for f/u management of his chronic conditions. He was started on HCTZ on his last office visit. Since starting, he denies any significant side effects. Denies any light-headedness, dizziness, blurry vision or headaches. Mentions that he has a blood pressure cuff at home but has not routinely checked his bp.  A/P At goal. On amlodipine, hctz.  Appear to be doing well. Will check bmp this visit after starting hctz last visit. - Bmp - C/w amlodipine 5mg , hctz 25mg  daily  Addendum: Mild hypokalemia noted. Will advise to decrease hctz dose in half to 12.5mg     Patient discussed with Dr. 46  - , PGY3 [facility] Internal Medicine Pager: [phone]

## 2020-07-30 ENCOUNTER — Encounter: Payer: Self-pay | Admitting: Internal Medicine

## 2020-07-30 LAB — BMP8+ANION GAP
Anion Gap: 19 mmol/L — ABNORMAL HIGH (ref 10.0–18.0)
BUN/Creatinine Ratio: 12 (ref 9–20)
BUN: 15 mg/dL (ref 6–24)
CO2: 24 mmol/L (ref 20–29)
Calcium: 9.7 mg/dL (ref 8.7–10.2)
Chloride: 96 mmol/L (ref 96–106)
Creatinine, Ser: 1.21 mg/dL (ref 0.76–1.27)
GFR calc Af Amer: 85 mL/min/{1.73_m2} (ref >59)
GFR calc non Af Amer: 74 mL/min/{1.73_m2} (ref >59)
Glucose: 95 mg/dL (ref 65–99)
Potassium: 3.4 mmol/L — ABNORMAL LOW (ref 3.5–5.2)
Sodium: 139 mmol/L (ref 134–144)

## 2020-07-30 MED ORDER — HYDROCHLOROTHIAZIDE 12.5 MG PO TABS: 12.5000 mg | ORAL_TABLET | Freq: Every day | ORAL | 3 refills | Status: DC

## 2020-07-30 NOTE — Addendum Note (Signed)
Addended by: Theotis Barrio on: 07/30/2020 01:55 PM   Modules accepted: Orders

## 2020-07-30 NOTE — Assessment & Plan Note (Addendum)
BP Readings from Last 3 Encounters:  07/29/20 126/82  07/07/20 (!) 144/78  06/24/20 (!) 183/101   Mr.Bair is a 39 yo M w/ PMH of HTN presenting for f/u management of his chronic conditions. He was started on HCTZ on his last office visit. Since starting, he denies any significant side effects. Denies any light-headedness, dizziness, blurry vision or headaches. Mentions that he has a blood pressure cuff at home but has not routinely checked his bp.  A/P At goal. On amlodipine, hctz. Appear to be doing well. Will check bmp this visit after starting hctz last visit. - Bmp - C/w amlodipine 5mg , hctz 25mg  daily  Addendum: Mild hypokalemia noted. Will advise to decrease hctz dose in half to 12.5mg 

## 2020-08-02 NOTE — Progress Notes (Signed)
Internal Medicine Clinic Attending  Case discussed with Dr. Lee  At the time of the visit.  We reviewed the resident's history and exam and pertinent patient test results.  I agree with the assessment, diagnosis, and plan of care documented in the resident's note.    

## 2020-11-11 ENCOUNTER — Ambulatory Visit (HOSPITAL_COMMUNITY)
Admission: RE | Admit: 2020-11-11 | Discharge: 2020-11-11 | Disposition: A | Discharge status: Home / Self Care | Payer: 59 | Source: Ambulatory Visit | Attending: Internal Medicine | Admitting: Internal Medicine

## 2020-11-11 ENCOUNTER — Other Ambulatory Visit: Payer: Self-pay

## 2020-11-11 ENCOUNTER — Encounter: Payer: Self-pay | Admitting: Student

## 2020-11-11 ENCOUNTER — Ambulatory Visit (INDEPENDENT_AMBULATORY_CARE_PROVIDER_SITE_OTHER): Payer: 59 | Admitting: Student

## 2020-11-11 VITALS — BP 157/88 | HR 77 | Temp 98.4°F | Ht 63.0 in | Wt 160.0 lb

## 2020-11-11 DIAGNOSIS — E785 Hyperlipidemia, unspecified: Secondary | ICD-10-CM | POA: Insufficient documentation

## 2020-11-11 DIAGNOSIS — E782 Mixed hyperlipidemia: Secondary | ICD-10-CM

## 2020-11-11 DIAGNOSIS — R0789 Other chest pain: Secondary | ICD-10-CM | POA: Diagnosis present

## 2020-11-11 DIAGNOSIS — I10 Essential (primary) hypertension: Secondary | ICD-10-CM | POA: Diagnosis not present

## 2020-11-11 LAB — BASIC METABOLIC PANEL
Anion gap: 11 (ref 5–15)
BUN: 11 mg/dL (ref 6–20)
CO2: 29 mmol/L (ref 22–32)
Calcium: 10.2 mg/dL (ref 8.9–10.3)
Chloride: 99 mmol/L (ref 98–111)
Creatinine, Ser: 1.29 mg/dL — ABNORMAL HIGH (ref 0.61–1.24)
GFR, Estimated: >60 mL/min (ref >60)
Glucose, Bld: 89 mg/dL (ref 70–99)
Potassium: 3.2 mmol/L — ABNORMAL LOW (ref 3.5–5.1)
Sodium: 139 mmol/L (ref 135–145)

## 2020-11-11 LAB — LIPID PANEL
Cholesterol: 215 mg/dL — ABNORMAL HIGH (ref 0–200)
HDL: 45 mg/dL (ref >40)
LDL Cholesterol: 122 mg/dL — ABNORMAL HIGH (ref 0–99)
Total CHOL/HDL Ratio: 4.8 RATIO
Triglycerides: 239 mg/dL — ABNORMAL HIGH (ref <150)
VLDL: 48 mg/dL — ABNORMAL HIGH (ref 0–40)

## 2020-11-11 LAB — TROPONIN I (HIGH SENSITIVITY): Troponin I (High Sensitivity): 5 ng/L (ref <18)

## 2020-11-11 LAB — D-DIMER, QUANTITATIVE: D-Dimer, Quant: <0.27 ug/mL-FEU (ref 0.00–0.50)

## 2020-11-11 MED ORDER — HYDROCHLOROTHIAZIDE 25 MG PO TABS: 25.0000 mg | ORAL_TABLET | Freq: Every day | ORAL | 1 refills | Status: DC

## 2020-11-11 MED ORDER — LISINOPRIL 10 MG PO TABS: 10.0000 mg | ORAL_TABLET | Freq: Every day | ORAL | 0 refills | Status: DC

## 2020-11-11 NOTE — Progress Notes (Signed)
   CC: hypertension, headache, chest pain  HPI:  Jeremiah [name] is a 40 y.o. male with history as below presenting for follow up HTN, also having new headache and chest pain. Please refer to problem based charting for further details of assessment and plan of current problem and chronic medical conditions.  Past Medical History:  Diagnosis Date  . Atrophy of left kidney   . History of acute renal failure    ADX 12-30-2014  . History of kidney stones 12/2013  . Hypertension   . Incomplete right bundle branch block   . Nephrolithiasis    BILATERAL --- PER CT  03-13-2014   . Right ureteral stone    Review of Systems:   Review of Systems  Constitutional: Negative for chills and fever.  Respiratory: Positive for shortness of breath. Negative for cough.   Cardiovascular: Positive for chest pain. Negative for leg swelling.  Gastrointestinal: Positive for nausea. Negative for abdominal pain and vomiting.  Musculoskeletal: Negative for myalgias.  Neurological: Positive for headaches. Negative for dizziness, tingling, focal weakness, loss of consciousness and weakness.  All other systems reviewed and are negative.    Physical Exam: Vitals:   11/11/20 1332  BP: (!) 157/88  Pulse: 77  Temp: 98.4 F (36.9 C)  TempSrc: Oral  SpO2: 98%  Weight: 160 lb (72.6 kg)  Height: 5\' 3"  (1.6 m)   Constitutional: no acute distress Head: atraumatic ENT: external ears normal Cardiovascular: regular rate and rhythm, normal heart sounds, extremities well perfused Pulmonary: effort normal, normal breath sounds bilaterally Abdominal: flat, nontender, no rebound tenderness, bowel sounds normal Musculoskeletal: no chest tenderness Skin: warm and dry Neurological: alert, no focal deficit Psychiatric: normal mood and affect  Assessment & Plan:   See Encounters Tab for problem based charting.  Patient discussed with Dr. 

## 2020-11-11 NOTE — Assessment & Plan Note (Signed)
LDL of 122, triglyceride of 239. The 10-year ASCVD risk score Jeremiah George DC Jr., et al., 2013) is: 3.2%   -Hold off on statin therapy given low ASCVD risk

## 2020-11-11 NOTE — Patient Instructions (Signed)
C?m ?n b?n ? cho php chng ti tham gia ch?m [place] b?n ngy hm nay, r?t vui ???c g?p b?n. Chng ti ? th?o lu?n v? b?nh t?ng huy?t p, ?au ??u v ?au ng?c c?a b?n  Ti ?ang ki?m tra cc phng th nghi?m ny: Ddimer, troponin, BMP, aldosterone v renin  B?n ? c m?t EKG, Ddimer v troponin khi?n b?n yn tm ? ?y, v v?y ti khng ngh? r?ng b?n ?ang g?p ph?i m?t bi?n c? c?p tnh nh? ?au tim ho?c thuyn t?c ph?i.  ??i v?i huy?t p c?a b?n, ti ?ang b? sung m?t lo?i thu?c c tn l Lisinopril, ngy u?ng 1 vin (10mg ) m?t l?n.   Ti ? th?c hi?n nh?ng thay ??i ny ??i v?i thu?c c?a b?n: B?T ??U lisinopril 10mg  m?i ngy  Vui lng ti khm sau 2 tu?n ?? ?nh gi l?i huy?t p v ki?m tra ?i?n gi?i   Xin c?m ?n v vui lng g?i cho Phng khm N?i theo s? [phone] n?u b?n c b?t k? cu h?i no.  T?t nh?t, Ti?n s? Rosell Khouri  ______________________________________________________________   Thank you for allowing to be a part of your care today, it was a pleasure seeing you. We discussed your hypertension, headache, and chest pain  I am checking these labs: Ddimer, troponin, BMP, aldosterone, and renin  You had a reassuring EKG, Ddimer, and troponin here, so I do not think you are having an acute event such as heart attack or pulmonary embolism.  For your blood pressure, I am adding a medication called Lisinopril. Take 1 tablet (10mg ) once a day.   I have made these changes to your medications: START lisinopril 10mg  daily  Please follow up in 2 weeks to reassess blood pressure and check your electrolytes   Thank you, and please call the Internal Medicine Clinic at [phone] if you have any questions.  Best, Dr. [place]

## 2020-11-11 NOTE — Assessment & Plan Note (Addendum)
BP Readings from Last 3 Encounters:  11/11/20 (!) 157/88  07/29/20 126/82  07/07/20 (!) 144/78  HCTZ 25 mg added 2 visits ago with good subsequent control.  This was later changed to 12.5 mg due to mild hypokalemia, but he is actually taking 25 mg according to the picture of his pill bottle from this morning which he showed me.  He endorses compliance with daily medications. Reports BP reading of 160/70 at home. Denies any changes in medications or work since that time, but notes his work involves pushing heavy boxes. Reports associated headache and chest pain. The headache is located to the medial part of his forehead and goes back to the occipital region, sometimes occurs behind the eyes as well. Has had this previously with headaches. No focal weakness, speech difficulty, vision changes. Also states that his entire body feels "hot," which he has experienced before with high blood pressure. Last BMP with normal sodium, baseline creatinine of 1.21, GFR 74, K of 3.4.   -continue amlodipine 5mg  -continue hctz 25mg  -start lisinopril 10mg  daily -f/u BMP -f/u aldosterone/renin (HTN + hypokalemia) -return in 2 weeks, consider hctz-lisinopril combo pill in the future  Addendum: normal aldosterone/renin ratio

## 2020-11-11 NOTE — Assessment & Plan Note (Signed)
Started yesterday while he was at rest, but is worse with exertion. It has been continuous during this time. Has associated SOB and nausea. The pain is worse with deep breath. No hx of blood clots and no leg swelling/pain. Describes the pain as burning. Denies diaphoresis, leg pain, leg swelling. 10-year ASCVD risk is low at 3.2%.  -EKG reassuring -negative troponin -troponin negative -treat BP as documented elsewhere

## 2020-11-12 NOTE — Progress Notes (Signed)
[facility] Attending  Case discussed with Dr. Chen  At the time of the visit.  We reviewed the resident's history and exam and pertinent patient test results.  I agree with the assessment, diagnosis, and plan of care documented in the resident's note. 

## 2020-11-17 LAB — ALDOSTERONE + RENIN ACTIVITY W/ RATIO
ALDO / PRA Ratio: 3.9 (ref 0.0–30.0)
Aldosterone: 8.4 ng/dL (ref 0.0–30.0)
PRA LC/MS/MS: 2.155 ng/mL/hr (ref 0.167–5.380)

## 2020-12-04 ENCOUNTER — Other Ambulatory Visit: Payer: Self-pay | Admitting: Student

## 2020-12-19 ENCOUNTER — Other Ambulatory Visit: Payer: Self-pay | Admitting: Student

## 2020-12-30 ENCOUNTER — Encounter: Payer: 59 | Admitting: Student

## 2021-06-26 ENCOUNTER — Other Ambulatory Visit: Payer: Self-pay | Admitting: Student

## 2021-07-01 NOTE — Telephone Encounter (Signed)
Please schedule pt a f/u appt for BP recheck per Dr Cyndie Chime. Thanks

## 2021-07-20 ENCOUNTER — Encounter: Payer: 59 | Admitting: Student

## 2021-07-29 ENCOUNTER — Encounter: Payer: Self-pay | Admitting: Internal Medicine

## 2021-07-29 ENCOUNTER — Ambulatory Visit (INDEPENDENT_AMBULATORY_CARE_PROVIDER_SITE_OTHER): Payer: 59 | Admitting: Internal Medicine

## 2021-07-29 ENCOUNTER — Other Ambulatory Visit: Payer: Self-pay

## 2021-07-29 VITALS — BP 114/79 | HR 73 | Temp 98.2°F | Resp 20 | Ht 63.0 in | Wt 155.7 lb

## 2021-07-29 DIAGNOSIS — Z23 Encounter for immunization: Secondary | ICD-10-CM | POA: Diagnosis not present

## 2021-07-29 DIAGNOSIS — I10 Essential (primary) hypertension: Secondary | ICD-10-CM | POA: Diagnosis not present

## 2021-07-29 MED ORDER — AMLODIPINE-VALSARTAN-HCTZ 5-160-12.5 MG PO TABS: 1.0000 | ORAL_TABLET | Freq: Every day | ORAL | 1 refills | Status: DC

## 2021-07-29 NOTE — Patient Instructions (Addendum)
Thank you for trusting me with your care. To recap, today we discussed the following:   Hypertension - BMP8+Anion Gap - amLODIPine-Valsartan-HCTZ 5-160-12.5 MG TABS; Take 1 tablet by mouth daily in the afternoon.  Dispense: 90 tablet; Refill: 1

## 2021-07-29 NOTE — Progress Notes (Signed)
   CC: hypertension , need for immunization against influenza  HPI:[name] is a 40 y.o. male who presents for evaluation of hypertension. Please see individual problem based A/P for details.   Past Medical History:  Diagnosis Date   Atrophy of left kidney    History of acute renal failure    ADX 12-30-2014   History of kidney stones 12/2013   Hypertension    Incomplete right bundle branch block    Nephrolithiasis    BILATERAL --- PER CT  03-13-2014    Right ureteral stone    Review of Systems:   Review of Systems  Constitutional:  Negative for chills and fever.  Eyes:  Negative for blurred vision and double vision.  Neurological:  Negative for dizziness and headaches.    Physical Exam: Vitals:   07/29/21 1016  BP: 114/79  Pulse: 73  Resp: 20  Temp: 98.2 F (36.8 C)  SpO2: 100%  Weight: 155 lb 11.2 oz (70.6 kg)  Height: 5\' 3"  (1.6 m)   General: NAD, normal BMI, in person translator used  HEENT: Conjunctiva nl , antiicteric sclerae, moist mucous membranes, no exudate or erythema Cardiovascular: Normal rate, regular rhythm.  No murmurs, rubs, or gallops Pulmonary : Equal breath sounds, No wheezes, rales, or rhonchi Abdominal: soft, nontender,  bowel sounds present Ext: No edema in lower extremities, no tenderness to palpation of lower extremities.   Assessment & Plan:   See Encounters Tab for problem based charting.  Patient discussed with Dr. 

## 2021-07-30 LAB — BMP8+ANION GAP
Anion Gap: 14 mmol/L (ref 10.0–18.0)
BUN/Creatinine Ratio: 16 (ref 9–20)
BUN: 20 mg/dL (ref 6–24)
CO2: 24 mmol/L (ref 20–29)
Calcium: 9.9 mg/dL (ref 8.7–10.2)
Chloride: 101 mmol/L (ref 96–106)
Creatinine, Ser: 1.24 mg/dL (ref 0.76–1.27)
Glucose: 84 mg/dL (ref 70–99)
Potassium: 4.3 mmol/L (ref 3.5–5.2)
Sodium: 139 mmol/L (ref 134–144)
eGFR: 74 mL/min/{1.73_m2} (ref >59)

## 2021-07-31 ENCOUNTER — Encounter: Payer: Self-pay | Admitting: Internal Medicine

## 2021-07-31 DIAGNOSIS — Z23 Encounter for immunization: Secondary | ICD-10-CM | POA: Insufficient documentation

## 2021-07-31 NOTE — Assessment & Plan Note (Signed)
   Need for immunization against influenza - Flu Vaccine QUAD 6mo+IM (Fluarix, Fluzone & Alfiuria Quad PF)   

## 2021-07-31 NOTE — Assessment & Plan Note (Signed)
Patient's BP today is 114/79 with a goal of <140/80. The patient endorses adherence to his medication regimen. He was on field trip with son and missed last 2 week follow up. Denied, chest pain, headache, visual changes, lightheadedness, weakness, dizziness on standing, swelling in the feet or ankles.   Assessment/Plan: Essential Hypertension . Patient normal BMI. Discussed DASH diet. Will combine medication, Exforge listed on formulary. Currently on amlodipine 5 mg, HCTZ 25 mg, lisinopril 10 mg.  (Lisinopril 20 mg approximately equal to 160 mg Valsartan) Will decease HCTZ component given increase in ACE/ARB component.   - BMP8+Anion Gap - amLODIPine-Valsartan-HCTZ 5-160-12.5 MG TABS; Take 1 tablet by mouth daily in the afternoon.  Dispense: 90 tablet; Refill: 1 - Follow up 6 weeks to check bmp and insure patients blood pressure is stable

## 2021-08-14 NOTE — Progress Notes (Signed)
Internal Medicine Clinic Attending  I saw and evaluated the patient.  I personally confirmed the key portions of the history and exam documented by Dr. Steen and I reviewed pertinent patient test results.  The assessment, diagnosis, and plan were formulated together and I agree with the documentation in the resident's note.     

## 2022-01-09 ENCOUNTER — Other Ambulatory Visit: Payer: Self-pay | Admitting: Student

## 2022-01-18 ENCOUNTER — Ambulatory Visit (INDEPENDENT_AMBULATORY_CARE_PROVIDER_SITE_OTHER): Payer: 59 | Admitting: Student

## 2022-01-18 ENCOUNTER — Encounter: Payer: Self-pay | Admitting: Student

## 2022-01-18 VITALS — BP 159/95 | HR 70 | Temp 98.2°F | Ht 63.0 in | Wt 156.2 lb

## 2022-01-18 DIAGNOSIS — E785 Hyperlipidemia, unspecified: Secondary | ICD-10-CM | POA: Diagnosis not present

## 2022-01-18 DIAGNOSIS — Z23 Encounter for immunization: Secondary | ICD-10-CM

## 2022-01-18 DIAGNOSIS — I10 Essential (primary) hypertension: Secondary | ICD-10-CM

## 2022-01-18 MED ORDER — AMLODIPINE-VALSARTAN-HCTZ 5-160-12.5 MG PO TABS: 1.0000 | ORAL_TABLET | Freq: Every day | ORAL | 1 refills | Status: DC

## 2022-01-18 NOTE — Assessment & Plan Note (Addendum)
His blood pressure is elevated at 159/95.  He is currently taking lisinopril 10 mg and HCTZ 25 mg daily.  Only misses doses 2 or 3 times a week.  He endorses high salt intake including fish sauce. ? ?The plan from last visit was to switch him to amlodipine- valsartan-HCTZ but patient never picked this medication up.  States that the pharmacy never called him. ? ?Last BMP October 2022 was unremarkable. ? ?-Resume amlodipine-valsartan-HCTZ 5-1 60-12.5 mg  ?-Stop lisinopril and HCTZ ?-Repeat BMP today ?-Return in 3 months for blood pressure recheck ? ?Addendum ?BMP unremarkable ?Patient has picked up his medication today. ?

## 2022-01-18 NOTE — Assessment & Plan Note (Addendum)
Repeat lipid panel ? ?Addendum ?10 years ASCVD 3.6%.  No statin recommended. ?Counseled patient on healthy lifestyle with better food choices. ? ?

## 2022-01-18 NOTE — Progress Notes (Addendum)
? ?  CC: F/u on HTN  ? ?HPI: ? ?[name] is a 41 y.o. with past medical history of hypertension, hyperlipidemia who presented to the clinic today to follow-up on his blood pressure. ? ?Please see problem based charting for detail ? ?Past Medical History:  ?Diagnosis Date  ? Atrophy of left kidney   ? History of acute renal failure   ? ADX 12-30-2014  ? History of kidney stones 12/2013  ? Hypertension   ? Incomplete right bundle branch block   ? Nephrolithiasis   ? BILATERAL --- PER CT  03-13-2014   ? Right ureteral stone   ? ?Review of Systems:  per HPI ? ?Physical Exam: ? ?Vitals:  ? 01/18/22 1004 01/18/22 1017  ?BP: (!) 163/89 (!) 159/95  ?Pulse: 68 70  ?Temp: 98.2 ?F (36.8 ?C)   ?TempSrc: Oral   ?SpO2: 100%   ?Weight: 156 lb 3.2 oz (70.9 kg)   ?Height: 5\' 3"  (1.6 m)   ? ?Physical Exam ?Constitutional:   ?   General: He is not in acute distress. ?   Appearance: He is not ill-appearing.  ?HENT:  ?   Head: Normocephalic.  ?Eyes:  ?   General:     ?   Right eye: No discharge.     ?   Left eye: No discharge.  ?   Conjunctiva/sclera: Conjunctivae normal.  ?Cardiovascular:  ?   Rate and Rhythm: Normal rate and regular rhythm.  ?Pulmonary:  ?   Effort: Pulmonary effort is normal. No respiratory distress.  ?   Breath sounds: Normal breath sounds. No wheezing.  ?Musculoskeletal:     ?   General: Normal range of motion.  ?Skin: ?   General: Skin is warm.  ?Neurological:  ?   Mental Status: He is alert.  ?Psychiatric:     ?   Mood and Affect: Mood normal.  ?  ? ?Assessment & Plan:  ? ?See Encounters Tab for problem based charting. ? ?Hypertension ?His blood pressure is elevated at 159/95.  He is currently taking lisinopril 10 mg and HCTZ 25 mg daily.  Only misses doses 2 or 3 times a week.  He endorses high salt intake including fish sauce. ? ?The plan from last visit was to switch him to amlodipine- valsartan-HCTZ but patient never picked this medication up.  States that the pharmacy never called him. ? ?Last BMP October  2022 was unremarkable. ? ?-Resume amlodipine-valsartan-HCTZ 5-1 60-12.5 mg  ?-Stop lisinopril and HCTZ ?-Repeat BMP today ?-Return in 3 months for blood pressure recheck ? ?Hyperlipidemia ?Repeat lipid panel  ? ? ?Patient discussed with Dr.  08-[birth year]1990   ?

## 2022-01-18 NOTE — Patient Instructions (Signed)
Mr. Tetreault, ? ?It was nice seeing you in the clinic today.  Here is a summary what we talked about ? ?1.  Your blood pressure is high today.  I will restart the combination pill amlodipine-valsartan-HCTZ again.  Please pick this up at the pharmacy.  If you have this medication, please stop lisinopril and hydrochlorothiazide. ? ?I will recheck your kidney function and cholesterol today. ? ?Please return in about 3 months ? ?Take care ? ?Dr. Cyndie Chime ?

## 2022-01-19 LAB — LIPID PANEL
Chol/HDL Ratio: 4.7 ratio (ref 0.0–5.0)
Cholesterol, Total: 214 mg/dL — ABNORMAL HIGH (ref 100–199)
HDL: 46 mg/dL (ref >39)
LDL Chol Calc (NIH): 137 mg/dL — ABNORMAL HIGH (ref 0–99)
Triglycerides: 173 mg/dL — ABNORMAL HIGH (ref 0–149)
VLDL Cholesterol Cal: 31 mg/dL (ref 5–40)

## 2022-01-19 LAB — BMP8+ANION GAP
Anion Gap: 18 mmol/L (ref 10.0–18.0)
BUN/Creatinine Ratio: 14 (ref 9–20)
BUN: 18 mg/dL (ref 6–24)
CO2: 24 mmol/L (ref 20–29)
Calcium: 9.9 mg/dL (ref 8.7–10.2)
Chloride: 98 mmol/L (ref 96–106)
Creatinine, Ser: 1.3 mg/dL — ABNORMAL HIGH (ref 0.76–1.27)
Glucose: 85 mg/dL (ref 70–99)
Potassium: 4.2 mmol/L (ref 3.5–5.2)
Sodium: 140 mmol/L (ref 134–144)
eGFR: 70 mL/min/{1.73_m2} (ref >59)

## 2022-01-23 NOTE — Progress Notes (Signed)
Internal Medicine Clinic Attending  Case discussed with Dr. Nguyen  At the time of the visit.  We reviewed the resident's history and exam and pertinent patient test results.  I agree with the assessment, diagnosis, and plan of care documented in the resident's note. 

## 2022-01-23 NOTE — Addendum Note (Signed)
Addended by: Dickie La on: 01/23/2022 04:16 PM ? ? Modules accepted: Level of Service ? ?

## 2022-02-17 ENCOUNTER — Encounter: Payer: Self-pay | Admitting: Student

## 2022-02-17 ENCOUNTER — Ambulatory Visit (INDEPENDENT_AMBULATORY_CARE_PROVIDER_SITE_OTHER): Payer: 59 | Admitting: Student

## 2022-02-17 DIAGNOSIS — I10 Essential (primary) hypertension: Secondary | ICD-10-CM

## 2022-02-17 NOTE — Patient Instructions (Signed)
It was a pleasure seeing you in clinic. Today we discussed:   Blood Pressure You BP today is great Continue valsartan-amlodipine-hctz daily  Follow up in 6 months  If you have any questions or concerns, please call our clinic at [phone] between 9am-5pm and after hours call [phone] and ask for the internal medicine resident on call. If you feel you are having a medical emergency please call 911.   Thank you, we look forward to helping you remain healthy!

## 2022-02-17 NOTE — Progress Notes (Signed)
   Established Patient Office Visit  Subjective   Patient ID: Jeremiah [name], male    DOB: [birth year]  Age: 41 y.o. MRN: [id number]  Chief Complaint  Patient presents with   Follow-up    Jeremiah [name] 41 year old man who presents today for blood pressure follow-up.  Patient was interviewed in room with Elissa Lovett interpreter on telephone. Please refer to problem based charting for further details and assessment and plan of current problem and chronic medical conditions.    Patient Active Problem List   Diagnosis Date Noted   Need for immunization against influenza 07/31/2021   Hyperlipidemia 11/11/2020   Atypical chest pain 11/11/2020   Hypertension 07/08/2020      Review of Systems  All other systems reviewed and are negative.    Objective:     BP 130/69 (BP Location: Left Arm, Patient Position: Sitting, Cuff Size: Normal)   Pulse 71   Wt 157 lb 12.8 oz (71.6 kg)   SpO2 100%   BMI 27.95 kg/m  BP Readings from Last 3 Encounters:  02/17/22 130/69  01/18/22 (!) 159/95  07/29/21 114/79      Physical Exam Constitutional:      General: He is not in acute distress.    Appearance: Normal appearance. He is normal weight.  Eyes:     Extraocular Movements: Extraocular movements intact.     Pupils: Pupils are equal, round, and reactive to light.  Cardiovascular:     Rate and Rhythm: Normal rate and regular rhythm.     Pulses: Normal pulses.     Heart sounds: No murmur heard.   No friction rub. No gallop.  Pulmonary:     Effort: Pulmonary effort is normal.     Breath sounds: Normal breath sounds.  Abdominal:     General: Abdomen is flat. Bowel sounds are normal.     Palpations: Abdomen is soft.  Musculoskeletal:     Right lower leg: No edema.     Left lower leg: No edema.  Skin:    General: Skin is warm and dry.  Neurological:     General: No focal deficit present.     Mental Status: He is alert and oriented to person, place, and time.  Psychiatric:        Mood and  Affect: Mood normal.        Behavior: Behavior normal.    The 10-year ASCVD risk score (Arnett DK, et al., 2019) is: 2.1%    Assessment & Plan:   Problem List Items Addressed This Visit       Cardiovascular and Mediastinum   Hypertension    BP today is 139/69.  He is tolerating amlodipine-valsartan-hydrochlorothiazide 5-1 60/12.5 mg.  No side effects with taking this medication we will continue medication regimen.        Return in about 6 months (around 08/20/2022).    Quincy Simmonds, MD  Discussed with Dr. Mikey Bussing

## 2022-02-17 NOTE — Assessment & Plan Note (Addendum)
BP today is 139/69.  He is tolerating amlodipine-valsartan-hydrochlorothiazide 5-1 60/12.5 mg.  No side effects with taking this medication we will continue medication regimen.

## 2022-02-21 NOTE — Progress Notes (Signed)
Internal Medicine Clinic Attending  Case discussed with the resident at the time of the visit.  We reviewed the resident's history and exam and pertinent patient test results.  I agree with the assessment, diagnosis, and plan of care documented in the resident's note.  

## 2022-03-06 IMAGING — CT CT RENAL STONE PROTOCOL
2 of 4 series · 16 of 46 positions shown, 18 images · non-contrast
Comparison: 05/06/2015

CLINICAL DATA: Left flank pain for 1 week. History of
nephrolithiasis

EXAM:
CT ABDOMEN AND PELVIS WITHOUT CONTRAST
TECHNIQUE: Multidetector CT imaging of the abdomen and pelvis was performed
following the standard protocol without IV contrast.

[Series 2: axial st · axial · 0.70mm/px · z∈[+1182,+1582]mm · 13 of 92 slices shown, 15 images]
[im 6/92  soft-tissue]
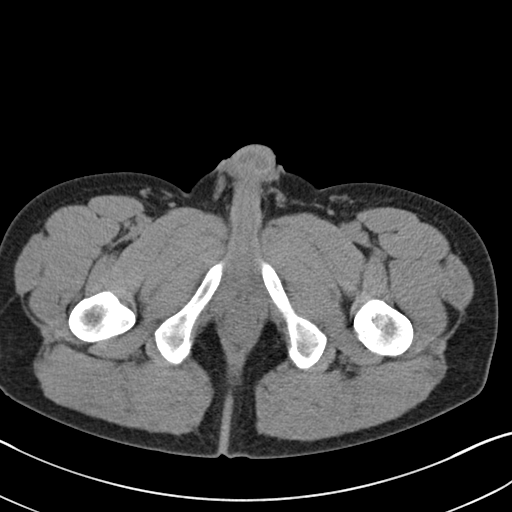
[im 6/92  bone]
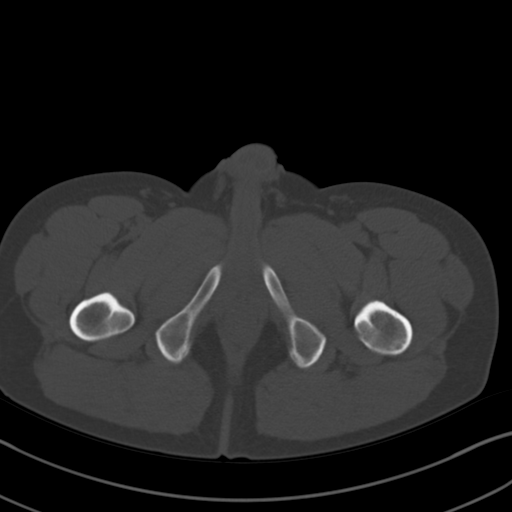
[im 12/92  soft-tissue]
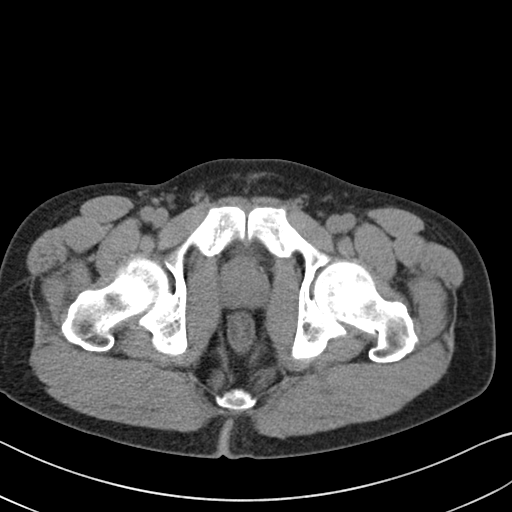
[im 18/92  soft-tissue]
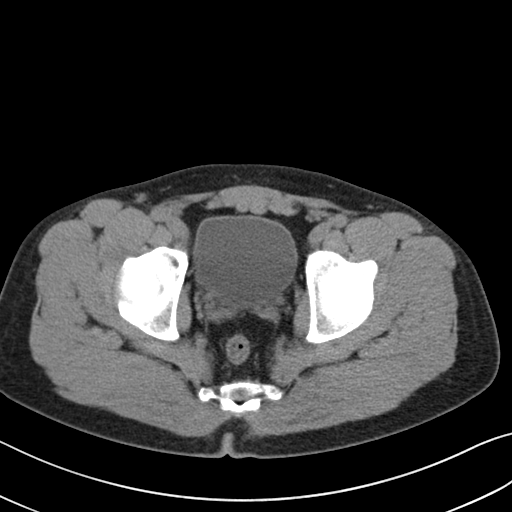
[im 29/92  soft-tissue]
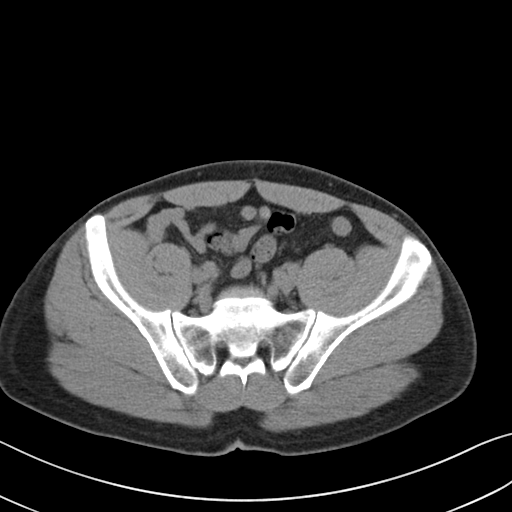
[im 35/92  soft-tissue]
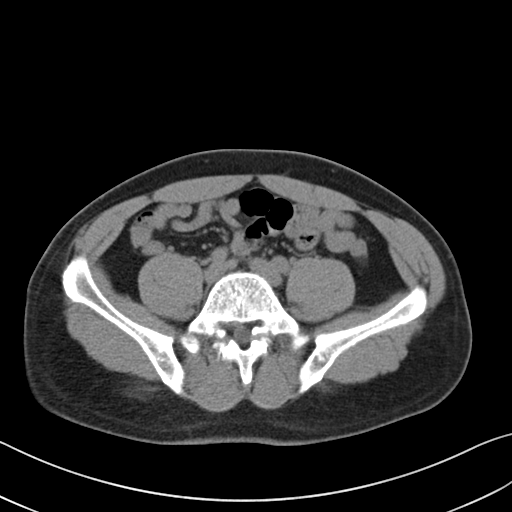
[im 40/92  soft-tissue]
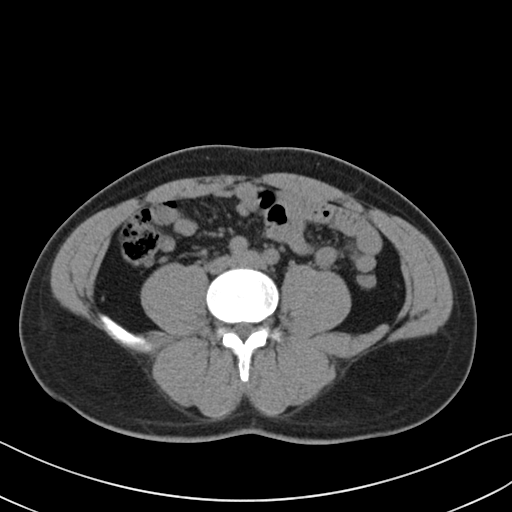
[im 46/92  soft-tissue]
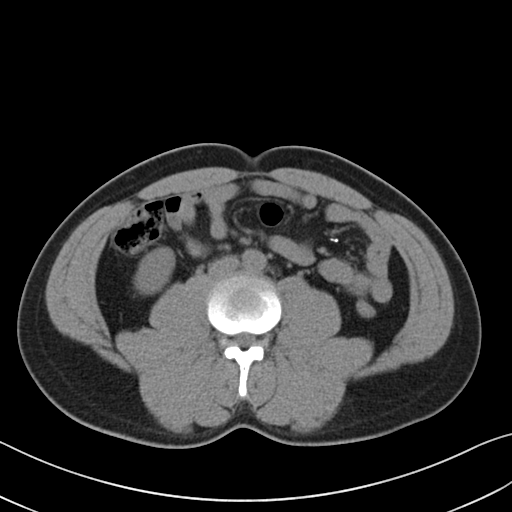
[im 52/92  soft-tissue]
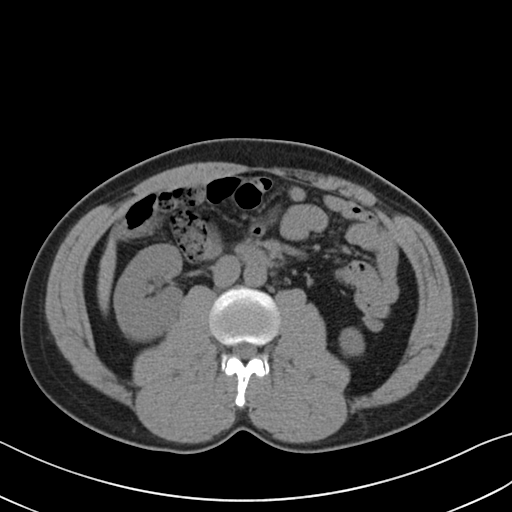
[im 57/92  soft-tissue]
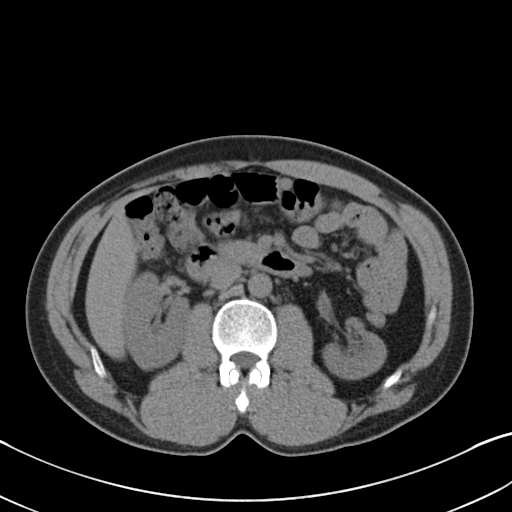
[im 57/92  bone]
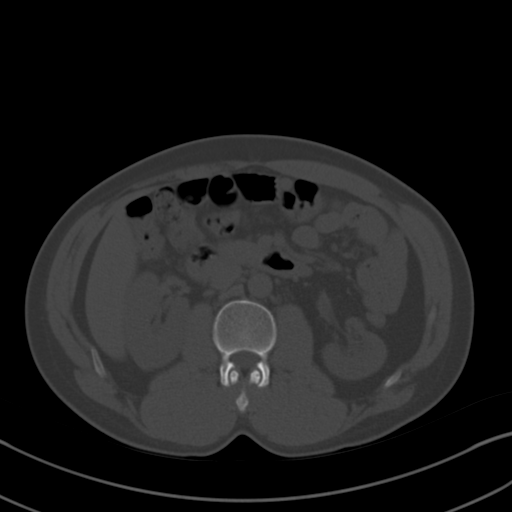
[im 63/92  soft-tissue]
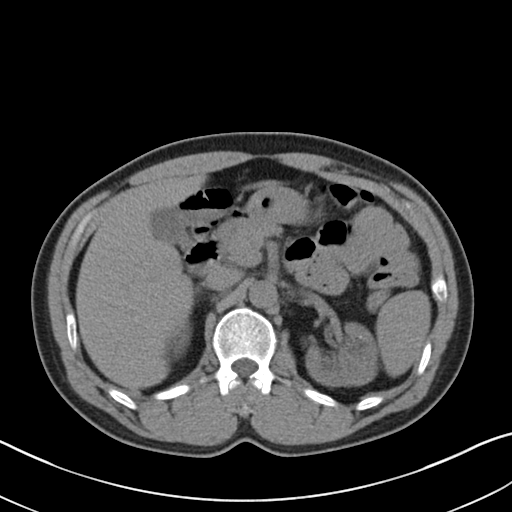
[im 74/92  soft-tissue]
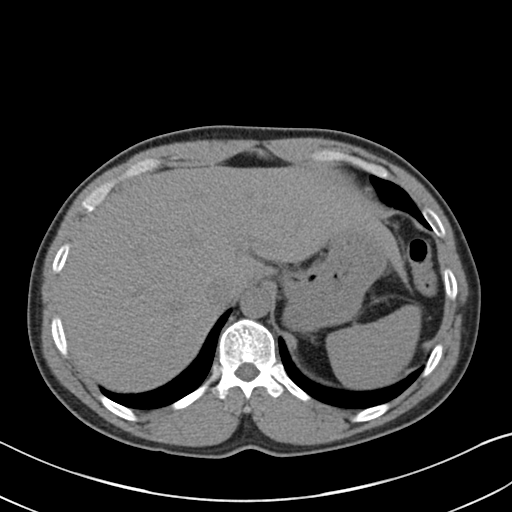
[im 80/92  soft-tissue]
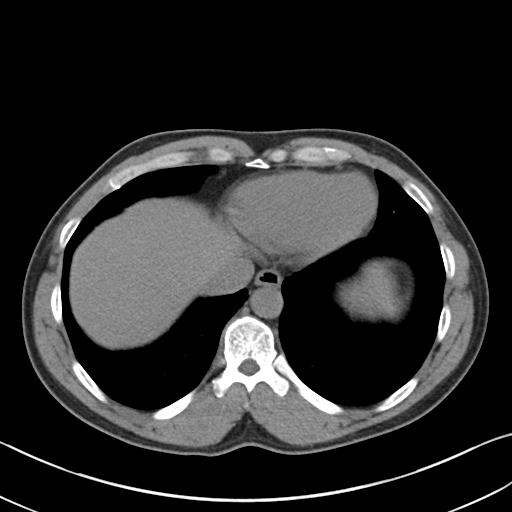
[im 86/92  soft-tissue]
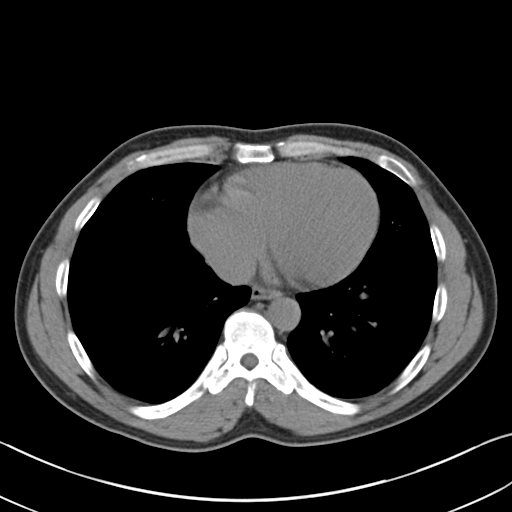

[Series 4: coronal · coronal · 0.76mm/px · 3 of 102 slices shown]
[im 34/102  soft-tissue]
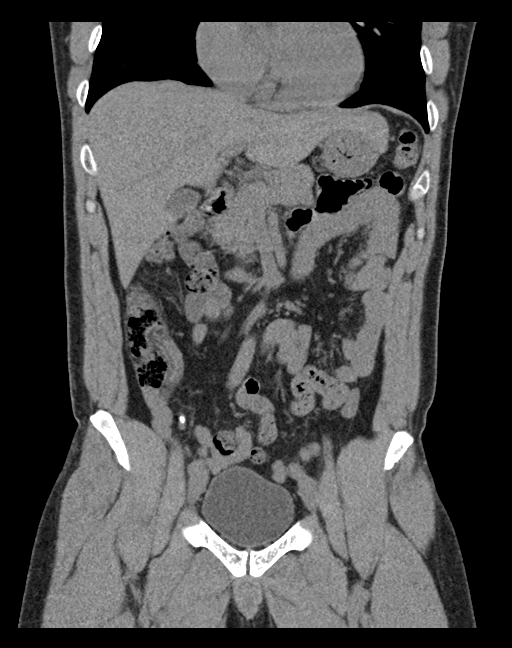
[im 45/102  soft-tissue]
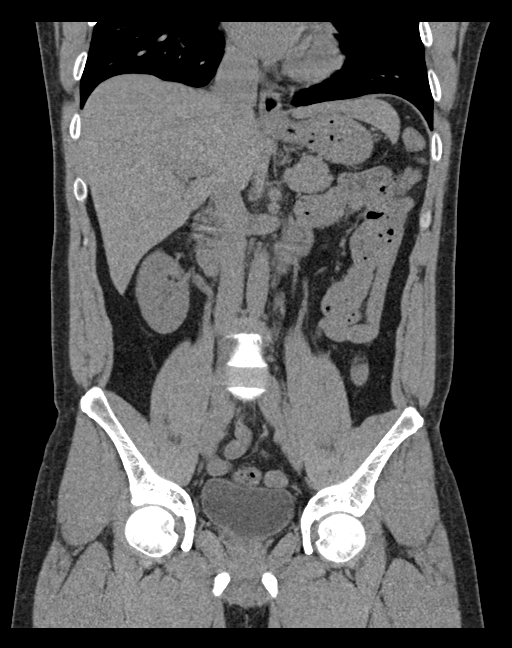
[im 57/102  soft-tissue]
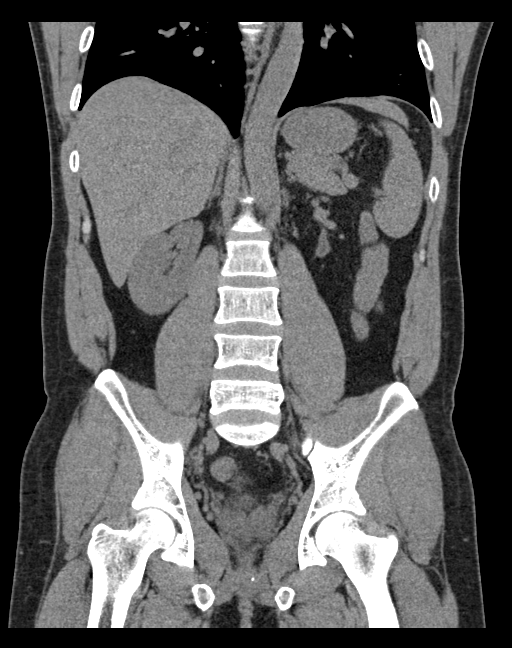

[16 of 46 positions shown; findings below may reference images not displayed]

FINDINGS: Lower chest: No acute abnormality.

Hepatobiliary: No focal liver abnormality is seen. No gallstones,
gallbladder wall thickening, or biliary dilatation.

Pancreas: Unremarkable. No pancreatic ductal dilatation or
surrounding inflammatory changes.

Spleen: Normal in size without focal abnormality.

Adrenals/Urinary Tract: Unremarkable adrenal glands. Multiple
nonobstructing left renal calculi with 2 stones in the inferior pole
measuring up to 6 mm. Left-sided pelviectasis without
hydronephrosis, similar in appearance to prior exam. Left renal
cortical atrophy. No left ureteral calculi. Unremarkable appearance
of the right kidney. No right renal or ureteral calculi. Urinary
bladder is unremarkable.

Stomach/Bowel: Stomach is within normal limits. Calcified
appendicolith within a nondilated, noninflamed appearing appendix
(series 2, image 62). No evidence of bowel wall thickening,
distention, or inflammatory changes.

Vascular/Lymphatic: No significant vascular findings are present. No
enlarged abdominal or pelvic lymph nodes.

Reproductive: Prostate is unremarkable.

Other: No abdominal wall hernia or abnormality. No abdominopelvic
ascites.

Musculoskeletal: Chronic bilateral L5 pars interarticularis defects.
No acute osseous findings.
IMPRESSION: 1. No acute abdominopelvic findings.
2. Multiple nonobstructing left renal calculi measuring up to 6 mm.
Chronic left-sided pelviectasis without hydronephrosis. Left renal
cortical atrophy is again seen.
3. Calcified appendicolith within a nondilated, noninflamed
appendix.
4. Chronic bilateral L5 pars interarticularis defects.

## 2022-06-10 ENCOUNTER — Observation Stay (HOSPITAL_COMMUNITY)
Admission: EM | Admit: 2022-06-10 | Discharge: 2022-06-13 | Disposition: A | Discharge status: Home / Self Care | Payer: 59 | Attending: Surgery | Admitting: Surgery

## 2022-06-10 ENCOUNTER — Other Ambulatory Visit: Payer: Self-pay

## 2022-06-10 ENCOUNTER — Encounter (HOSPITAL_COMMUNITY): Payer: Self-pay

## 2022-06-10 DIAGNOSIS — E876 Hypokalemia: Secondary | ICD-10-CM

## 2022-06-10 DIAGNOSIS — Z79899 Other long term (current) drug therapy: Secondary | ICD-10-CM | POA: Diagnosis not present

## 2022-06-10 DIAGNOSIS — K3589 Other acute appendicitis without perforation or gangrene: Secondary | ICD-10-CM | POA: Diagnosis present

## 2022-06-10 DIAGNOSIS — R109 Unspecified abdominal pain: Secondary | ICD-10-CM | POA: Diagnosis present

## 2022-06-10 DIAGNOSIS — I4510 Unspecified right bundle-branch block: Secondary | ICD-10-CM

## 2022-06-10 DIAGNOSIS — N200 Calculus of kidney: Secondary | ICD-10-CM

## 2022-06-10 DIAGNOSIS — K353 Acute appendicitis with localized peritonitis, without perforation or gangrene: Principal | ICD-10-CM | POA: Insufficient documentation

## 2022-06-10 DIAGNOSIS — N261 Atrophy of kidney (terminal): Secondary | ICD-10-CM

## 2022-06-10 DIAGNOSIS — K381 Appendicular concretions: Secondary | ICD-10-CM

## 2022-06-10 DIAGNOSIS — Z87448 Personal history of other diseases of urinary system: Secondary | ICD-10-CM

## 2022-06-10 DIAGNOSIS — I10 Essential (primary) hypertension: Secondary | ICD-10-CM | POA: Insufficient documentation

## 2022-06-10 DIAGNOSIS — Z789 Other specified health status: Secondary | ICD-10-CM

## 2022-06-10 DIAGNOSIS — K3580 Unspecified acute appendicitis: Secondary | ICD-10-CM

## 2022-06-10 DIAGNOSIS — Z87442 Personal history of urinary calculi: Secondary | ICD-10-CM

## 2022-06-10 MED ORDER — ONDANSETRON HCL 4 MG/2ML IJ SOLN
4.0000 mg | Freq: Once | INTRAMUSCULAR | Status: AC
  Administered 2022-06-11: 4 mg via INTRAVENOUS
  Filled 2022-06-10: qty 2

## 2022-06-10 MED ORDER — MORPHINE SULFATE (PF) 4 MG/ML IV SOLN
4.0000 mg | Freq: Once | INTRAVENOUS | Status: AC
  Administered 2022-06-11: 4 mg via INTRAVENOUS
  Filled 2022-06-10: qty 1

## 2022-06-10 NOTE — ED Provider Notes (Signed)
[facility]-EMERGENCY DEPT Provider Note   CSN: [id number] Arrival date & time: 06/10/22  2258     History {Add pertinent medical, surgical, social history, OB history to HPI:1} Chief Complaint  Patient presents with   Abdominal Pain    Jeremiah [name] is a 41 y.o. male.  The history is provided by the patient and medical records.  Abdominal Pain Associated symptoms: nausea    41 year old male presenting to the ED with abdominal pain.  Reports has been ongoing pretty much all day today he reports nausea but denies any vomiting.  He has not really wanted to eat or drink much.  No fever or chills.  No diarrhea.  Reports difficulty with urination since this afternoon.  History of kidney stones.  No prior abdominal surgeries.  Took Tylenol at home without relief.  Home Medications Prior to Admission medications   Medication Sig Start Date End Date Taking? Authorizing Provider  amLODIPine-Valsartan-HCTZ 5-160-12.5 MG TABS Take 1 tablet by mouth daily in the afternoon. 01/18/22   Doran Stabler, DO      Allergies    No known allergies    Review of Systems   Review of Systems  Gastrointestinal:  Positive for abdominal pain and nausea.  All other systems reviewed and are negative.   Physical Exam Updated Vital Signs BP (!) 175/105   Pulse 73   Temp 98.1 F (36.7 C) (Oral)   Resp 16   SpO2 98%   Physical Exam Vitals and nursing note reviewed.  Constitutional:      Appearance: He is well-developed.  HENT:     Head: Normocephalic and atraumatic.  Eyes:     Conjunctiva/sclera: Conjunctivae normal.     Pupils: Pupils are equal, round, and reactive to light.  Cardiovascular:     Rate and Rhythm: Normal rate and regular rhythm.     Heart sounds: Normal heart sounds.  Pulmonary:     Effort: Pulmonary effort is normal.     Breath sounds: Normal breath sounds.  Abdominal:     General: Bowel sounds are normal.     Palpations: Abdomen is soft.     Tenderness:  There is abdominal tenderness in the periumbilical area.  Musculoskeletal:        General: Normal range of motion.     Cervical back: Normal range of motion.  Skin:    General: Skin is warm and dry.  Neurological:     Mental Status: He is alert and oriented to person, place, and time.     ED Results / Procedures / Treatments   Labs (all labs ordered are listed, but only abnormal results are displayed) Labs Reviewed  LIPASE, BLOOD  COMPREHENSIVE METABOLIC PANEL  CBC  URINALYSIS, ROUTINE W REFLEX MICROSCOPIC    EKG None  Radiology No results found.  Procedures Procedures  {Document cardiac monitor, telemetry assessment procedure when appropriate:1}  Medications Ordered in ED Medications  morphine (PF) 4 MG/ML injection 4 mg (has no administration in time range)  ondansetron (ZOFRAN) injection 4 mg (has no administration in time range)    ED Course/ Medical Decision Making/ A&P                           Medical Decision Making Amount and/or Complexity of Data Reviewed Labs: ordered. Radiology: ordered.  Risk Prescription drug management.   ***  {Document critical care time when appropriate:1} {Document review of labs and clinical decision tools ie heart  score, Chads2Vasc2 etc:1}  {Document your independent review of radiology images, and any outside records:1} {Document your discussion with family members, caretakers, and with consultants:1} {Document social determinants of health affecting pt's care:1} {Document your decision making why or why not admission, treatments were needed:1} Final Clinical Impression(s) / ED Diagnoses Final diagnoses:  None    Rx / DC Orders ED Discharge Orders     None

## 2022-06-10 NOTE — ED Triage Notes (Signed)
Patient said his stomach has been hurting since this afternoon. He has not vomited but is nauseous all day. He said he has not been able to have a bowel movement or urinate since 1pm.

## 2022-06-11 ENCOUNTER — Encounter (HOSPITAL_COMMUNITY)
Admission: EM | Disposition: A | Discharge status: Home / Self Care | Payer: Self-pay | Source: Home / Self Care | Attending: Emergency Medicine

## 2022-06-11 ENCOUNTER — Other Ambulatory Visit: Payer: Self-pay

## 2022-06-11 ENCOUNTER — Encounter (HOSPITAL_COMMUNITY): Payer: Self-pay

## 2022-06-11 ENCOUNTER — Emergency Department (HOSPITAL_BASED_OUTPATIENT_CLINIC_OR_DEPARTMENT_OTHER): Payer: 59 | Admitting: Certified Registered Nurse Anesthetist

## 2022-06-11 ENCOUNTER — Emergency Department (HOSPITAL_COMMUNITY): Payer: 59

## 2022-06-11 ENCOUNTER — Emergency Department (HOSPITAL_COMMUNITY): Payer: 59 | Admitting: Certified Registered Nurse Anesthetist

## 2022-06-11 DIAGNOSIS — Z789 Other specified health status: Secondary | ICD-10-CM

## 2022-06-11 DIAGNOSIS — K381 Appendicular concretions: Secondary | ICD-10-CM

## 2022-06-11 DIAGNOSIS — K3580 Unspecified acute appendicitis: Secondary | ICD-10-CM

## 2022-06-11 DIAGNOSIS — I4510 Unspecified right bundle-branch block: Secondary | ICD-10-CM

## 2022-06-11 DIAGNOSIS — E876 Hypokalemia: Secondary | ICD-10-CM

## 2022-06-11 DIAGNOSIS — N200 Calculus of kidney: Secondary | ICD-10-CM

## 2022-06-11 DIAGNOSIS — Z87448 Personal history of other diseases of urinary system: Secondary | ICD-10-CM

## 2022-06-11 DIAGNOSIS — N261 Atrophy of kidney (terminal): Secondary | ICD-10-CM

## 2022-06-11 DIAGNOSIS — K3589 Other acute appendicitis without perforation or gangrene: Secondary | ICD-10-CM | POA: Diagnosis present

## 2022-06-11 HISTORY — PX: LAPAROSCOPIC APPENDECTOMY: SHX408

## 2022-06-11 LAB — COMPREHENSIVE METABOLIC PANEL
ALT: 16 U/L (ref 0–44)
AST: 25 U/L (ref 15–41)
Albumin: 4.5 g/dL (ref 3.5–5.0)
Alkaline Phosphatase: 52 U/L (ref 38–126)
Anion gap: 9 (ref 5–15)
BUN: 17 mg/dL (ref 6–20)
CO2: 24 mmol/L (ref 22–32)
Calcium: 9 mg/dL (ref 8.9–10.3)
Chloride: 103 mmol/L (ref 98–111)
Creatinine, Ser: 1.21 mg/dL (ref 0.61–1.24)
GFR, Estimated: >60 mL/min (ref >60)
Glucose, Bld: 104 mg/dL — ABNORMAL HIGH (ref 70–99)
Potassium: 3 mmol/L — ABNORMAL LOW (ref 3.5–5.1)
Sodium: 136 mmol/L (ref 135–145)
Total Bilirubin: 1.3 mg/dL — ABNORMAL HIGH (ref 0.3–1.2)
Total Protein: 7.9 g/dL (ref 6.5–8.1)

## 2022-06-11 LAB — URINALYSIS, ROUTINE W REFLEX MICROSCOPIC
Bilirubin Urine: NEGATIVE
Glucose, UA: NEGATIVE mg/dL
Hgb urine dipstick: NEGATIVE
Ketones, ur: NEGATIVE mg/dL
Leukocytes,Ua: NEGATIVE
Nitrite: NEGATIVE
Protein, ur: NEGATIVE mg/dL
Specific Gravity, Urine: 1.017 (ref 1.005–1.030)
pH: 7 (ref 5.0–8.0)

## 2022-06-11 LAB — CBC
HCT: 44.6 % (ref 39.0–52.0)
Hemoglobin: 15.3 g/dL (ref 13.0–17.0)
MCH: 25.6 pg — ABNORMAL LOW (ref 26.0–34.0)
MCHC: 34.3 g/dL (ref 30.0–36.0)
MCV: 74.6 fL — ABNORMAL LOW (ref 80.0–100.0)
Platelets: 283 10*3/uL (ref 150–400)
RBC: 5.98 MIL/uL — ABNORMAL HIGH (ref 4.22–5.81)
RDW: 12.7 % (ref 11.5–15.5)
WBC: 18.6 10*3/uL — ABNORMAL HIGH (ref 4.0–10.5)
nRBC: 0 % (ref 0.0–0.2)

## 2022-06-11 LAB — MAGNESIUM: Magnesium: 2.1 mg/dL (ref 1.7–2.4)

## 2022-06-11 LAB — LIPASE, BLOOD: Lipase: 32 U/L (ref 11–51)

## 2022-06-11 SURGERY — APPENDECTOMY, LAPAROSCOPIC
Anesthesia: General | Site: Abdomen

## 2022-06-11 MED ORDER — LACTATED RINGERS IV BOLUS: 1000.0000 mL | Freq: Three times a day (TID) | INTRAVENOUS | Status: DC | PRN

## 2022-06-11 MED ORDER — LIDOCAINE 2% (20 MG/ML) 5 ML SYRINGE
INTRAMUSCULAR | Status: DC | PRN
  Administered 2022-06-11: 100 mg via INTRAVENOUS

## 2022-06-11 MED ORDER — HYDROCHLOROTHIAZIDE 12.5 MG PO TABS
12.5000 mg | ORAL_TABLET | Freq: Every day | ORAL | Status: DC
  Administered 2022-06-12 – 2022-06-13 (×2): 12.5 mg via ORAL
  Filled 2022-06-11 (×2): qty 1

## 2022-06-11 MED ORDER — METHOCARBAMOL 500 MG PO TABS
1000.0000 mg | ORAL_TABLET | Freq: Four times a day (QID) | ORAL | Status: DC | PRN
  Administered 2022-06-11: 1000 mg via ORAL
  Filled 2022-06-11: qty 2

## 2022-06-11 MED ORDER — ACETAMINOPHEN 500 MG PO TABS
1000.0000 mg | ORAL_TABLET | Freq: Four times a day (QID) | ORAL | Status: DC
  Administered 2022-06-11 – 2022-06-13 (×7): 1000 mg via ORAL
  Filled 2022-06-11 (×7): qty 2

## 2022-06-11 MED ORDER — IRBESARTAN 150 MG PO TABS
150.0000 mg | ORAL_TABLET | Freq: Every day | ORAL | Status: DC
  Administered 2022-06-12 – 2022-06-13 (×2): 150 mg via ORAL
  Filled 2022-06-11 (×2): qty 1

## 2022-06-11 MED ORDER — MAGNESIUM HYDROXIDE 400 MG/5ML PO SUSP: 30.0000 mL | Freq: Every day | ORAL | Status: DC | PRN

## 2022-06-11 MED ORDER — FENTANYL CITRATE (PF) 100 MCG/2ML IJ SOLN
INTRAMUSCULAR | Status: AC
  Filled 2022-06-11: qty 2

## 2022-06-11 MED ORDER — METHOCARBAMOL 1000 MG/10ML IJ SOLN: 1000.0000 mg | Freq: Four times a day (QID) | INTRAVENOUS | Status: DC | PRN

## 2022-06-11 MED ORDER — METRONIDAZOLE 500 MG/100ML IV SOLN
500.0000 mg | Freq: Two times a day (BID) | INTRAVENOUS | Status: DC
  Administered 2022-06-11 – 2022-06-12 (×3): 500 mg via INTRAVENOUS
  Filled 2022-06-11 (×3): qty 100

## 2022-06-11 MED ORDER — MAGIC MOUTHWASH: 15.0000 mL | Freq: Four times a day (QID) | ORAL | Status: DC | PRN

## 2022-06-11 MED ORDER — TRAMADOL HCL 50 MG PO TABS: 50.0000 mg | ORAL_TABLET | Freq: Four times a day (QID) | ORAL | 0 refills | Status: DC | PRN

## 2022-06-11 MED ORDER — ONDANSETRON HCL 4 MG/2ML IJ SOLN: 4.0000 mg | Freq: Once | INTRAMUSCULAR | Status: DC | PRN

## 2022-06-11 MED ORDER — PROPOFOL 10 MG/ML IV BOLUS
INTRAVENOUS | Status: DC | PRN
  Administered 2022-06-11: 150 mg via INTRAVENOUS

## 2022-06-11 MED ORDER — SUGAMMADEX SODIUM 500 MG/5ML IV SOLN
INTRAVENOUS | Status: AC
  Filled 2022-06-11: qty 5

## 2022-06-11 MED ORDER — CALCIUM POLYCARBOPHIL 625 MG PO TABS
625.0000 mg | ORAL_TABLET | Freq: Two times a day (BID) | ORAL | Status: DC
  Administered 2022-06-11 – 2022-06-13 (×4): 625 mg via ORAL
  Filled 2022-06-11 (×4): qty 1

## 2022-06-11 MED ORDER — HYDROMORPHONE HCL 1 MG/ML IJ SOLN
1.0000 mg | Freq: Once | INTRAMUSCULAR | Status: AC
  Administered 2022-06-11: 1 mg via INTRAVENOUS
  Filled 2022-06-11: qty 1

## 2022-06-11 MED ORDER — ENALAPRILAT 1.25 MG/ML IV SOLN: 0.6250 mg | Freq: Four times a day (QID) | INTRAVENOUS | Status: DC | PRN

## 2022-06-11 MED ORDER — AMOXICILLIN-POT CLAVULANATE 875-125 MG PO TABS: 1.0000 | ORAL_TABLET | Freq: Two times a day (BID) | ORAL | 0 refills | Status: DC

## 2022-06-11 MED ORDER — ROCURONIUM BROMIDE 10 MG/ML (PF) SYRINGE
PREFILLED_SYRINGE | INTRAVENOUS | Status: DC | PRN
  Administered 2022-06-11: 40 mg via INTRAVENOUS
  Administered 2022-06-11: 10 mg via INTRAVENOUS

## 2022-06-11 MED ORDER — AMLODIPINE BESYLATE 5 MG PO TABS
5.0000 mg | ORAL_TABLET | Freq: Every day | ORAL | Status: DC
Start: 2022-06-12 — End: 2022-06-13
  Administered 2022-06-12 – 2022-06-13 (×2): 5 mg via ORAL
  Filled 2022-06-11 (×2): qty 1

## 2022-06-11 MED ORDER — FENTANYL CITRATE PF 50 MCG/ML IJ SOSY
25.0000 ug | PREFILLED_SYRINGE | INTRAMUSCULAR | Status: DC | PRN
  Administered 2022-06-11 (×2): 25 ug via INTRAVENOUS

## 2022-06-11 MED ORDER — ENOXAPARIN SODIUM 40 MG/0.4ML IJ SOSY
40.0000 mg | PREFILLED_SYRINGE | INTRAMUSCULAR | Status: DC
  Administered 2022-06-12 – 2022-06-13 (×2): 40 mg via SUBCUTANEOUS
  Filled 2022-06-11 (×2): qty 0.4

## 2022-06-11 MED ORDER — OXYCODONE HCL 5 MG PO TABS: 5.0000 mg | ORAL_TABLET | ORAL | Status: DC | PRN

## 2022-06-11 MED ORDER — LACTATED RINGERS IV SOLN: INTRAVENOUS | Status: DC | PRN

## 2022-06-11 MED ORDER — BISACODYL 10 MG RE SUPP: 10.0000 mg | Freq: Every day | RECTAL | Status: DC | PRN

## 2022-06-11 MED ORDER — ONDANSETRON 4 MG PO TBDP: 4.0000 mg | ORAL_TABLET | Freq: Four times a day (QID) | ORAL | Status: DC | PRN

## 2022-06-11 MED ORDER — ONDANSETRON HCL 4 MG/2ML IJ SOLN
4.0000 mg | Freq: Four times a day (QID) | INTRAMUSCULAR | Status: DC | PRN
  Administered 2022-06-11: 4 mg via INTRAVENOUS
  Filled 2022-06-11: qty 2

## 2022-06-11 MED ORDER — FENTANYL CITRATE (PF) 100 MCG/2ML IJ SOLN
INTRAMUSCULAR | Status: DC | PRN
Start: 2022-06-11 — End: 2022-06-11
  Administered 2022-06-11 (×2): 50 ug via INTRAVENOUS

## 2022-06-11 MED ORDER — SUGAMMADEX SODIUM 200 MG/2ML IV SOLN
INTRAVENOUS | Status: DC | PRN
  Administered 2022-06-11: 200 mg via INTRAVENOUS

## 2022-06-11 MED ORDER — SODIUM CHLORIDE 0.9 % IV SOLN: Freq: Three times a day (TID) | INTRAVENOUS | Status: DC | PRN

## 2022-06-11 MED ORDER — TRAMADOL HCL 50 MG PO TABS
50.0000 mg | ORAL_TABLET | Freq: Four times a day (QID) | ORAL | Status: DC | PRN
  Administered 2022-06-11: 50 mg via ORAL
  Administered 2022-06-12 – 2022-06-13 (×2): 100 mg via ORAL
  Filled 2022-06-11 (×2): qty 2
  Filled 2022-06-11: qty 1

## 2022-06-11 MED ORDER — ONDANSETRON HCL 4 MG/2ML IJ SOLN
INTRAMUSCULAR | Status: DC | PRN
  Administered 2022-06-11: 4 mg via INTRAVENOUS

## 2022-06-11 MED ORDER — FENTANYL CITRATE PF 50 MCG/ML IJ SOSY
PREFILLED_SYRINGE | INTRAMUSCULAR | Status: AC
  Filled 2022-06-11: qty 1

## 2022-06-11 MED ORDER — ALUM & MAG HYDROXIDE-SIMETH 200-200-20 MG/5ML PO SUSP: 30.0000 mL | Freq: Four times a day (QID) | ORAL | Status: DC | PRN

## 2022-06-11 MED ORDER — METOPROLOL TARTRATE 12.5 MG HALF TABLET: 12.5000 mg | ORAL_TABLET | Freq: Two times a day (BID) | ORAL | Status: DC | PRN

## 2022-06-11 MED ORDER — LACTATED RINGERS IV BOLUS
1000.0000 mL | Freq: Once | INTRAVENOUS | Status: AC
  Administered 2022-06-11: 1000 mL via INTRAVENOUS

## 2022-06-11 MED ORDER — IOHEXOL 300 MG/ML  SOLN
100.0000 mL | Freq: Once | INTRAMUSCULAR | Status: AC | PRN
  Administered 2022-06-11: 100 mL via INTRAVENOUS

## 2022-06-11 MED ORDER — BUPIVACAINE LIPOSOME 1.3 % IJ SUSP
INTRAMUSCULAR | Status: DC | PRN
  Administered 2022-06-11: 20 mL

## 2022-06-11 MED ORDER — BUPIVACAINE LIPOSOME 1.3 % IJ SUSP
INTRAMUSCULAR | Status: AC
  Filled 2022-06-11: qty 20

## 2022-06-11 MED ORDER — ALBUTEROL SULFATE (2.5 MG/3ML) 0.083% IN NEBU: 2.5000 mg | INHALATION_SOLUTION | Freq: Four times a day (QID) | RESPIRATORY_TRACT | Status: DC | PRN

## 2022-06-11 MED ORDER — CHLORHEXIDINE GLUCONATE CLOTH 2 % EX PADS
6.0000 | MEDICATED_PAD | Freq: Once | CUTANEOUS | Status: AC
  Administered 2022-06-11: 6 via TOPICAL

## 2022-06-11 MED ORDER — POTASSIUM CHLORIDE 10 MEQ/100ML IV SOLN
10.0000 meq | INTRAVENOUS | Status: AC
  Administered 2022-06-11 (×4): 10 meq via INTRAVENOUS
  Filled 2022-06-11 (×4): qty 100

## 2022-06-11 MED ORDER — DEXAMETHASONE SODIUM PHOSPHATE 10 MG/ML IJ SOLN
INTRAMUSCULAR | Status: AC
  Filled 2022-06-11: qty 1

## 2022-06-11 MED ORDER — MIDAZOLAM HCL 5 MG/5ML IJ SOLN
INTRAMUSCULAR | Status: DC | PRN
  Administered 2022-06-11: 1 mg via INTRAVENOUS

## 2022-06-11 MED ORDER — CHLORHEXIDINE GLUCONATE CLOTH 2 % EX PADS: 6.0000 | MEDICATED_PAD | Freq: Once | CUTANEOUS | Status: DC

## 2022-06-11 MED ORDER — OXYCODONE HCL 5 MG PO TABS: 5.0000 mg | ORAL_TABLET | Freq: Once | ORAL | Status: DC | PRN

## 2022-06-11 MED ORDER — MENTHOL 3 MG MT LOZG: 1.0000 | LOZENGE | OROMUCOSAL | Status: DC | PRN

## 2022-06-11 MED ORDER — DIPHENHYDRAMINE HCL 50 MG/ML IJ SOLN: 12.5000 mg | Freq: Four times a day (QID) | INTRAMUSCULAR | Status: DC | PRN

## 2022-06-11 MED ORDER — BUPIVACAINE LIPOSOME 1.3 % IJ SUSP: 20.0000 mL | Freq: Once | INTRAMUSCULAR | Status: DC

## 2022-06-11 MED ORDER — SUCCINYLCHOLINE CHLORIDE 200 MG/10ML IV SOSY
PREFILLED_SYRINGE | INTRAVENOUS | Status: DC | PRN
  Administered 2022-06-11: 120 mg via INTRAVENOUS

## 2022-06-11 MED ORDER — 0.9 % SODIUM CHLORIDE (POUR BTL) OPTIME
TOPICAL | Status: DC | PRN
  Administered 2022-06-11: 1000 mL

## 2022-06-11 MED ORDER — SODIUM CHLORIDE 0.9 % IV SOLN: 8.0000 mg | Freq: Four times a day (QID) | INTRAVENOUS | Status: DC | PRN

## 2022-06-11 MED ORDER — STERILE WATER FOR IRRIGATION IR SOLN
Status: DC | PRN
  Administered 2022-06-11: 1000 mL

## 2022-06-11 MED ORDER — ACETAMINOPHEN 500 MG PO TABS: 1000.0000 mg | ORAL_TABLET | ORAL | Status: AC

## 2022-06-11 MED ORDER — LACTATED RINGERS IR SOLN
Status: DC | PRN
  Administered 2022-06-11: 1000 mL

## 2022-06-11 MED ORDER — PHENOL 1.4 % MT LIQD: 2.0000 | OROMUCOSAL | Status: DC | PRN

## 2022-06-11 MED ORDER — METOPROLOL TARTRATE 5 MG/5ML IV SOLN
5.0000 mg | Freq: Four times a day (QID) | INTRAVENOUS | Status: DC | PRN
  Administered 2022-06-11 (×2): 5 mg via INTRAVENOUS
  Filled 2022-06-11 (×2): qty 5

## 2022-06-11 MED ORDER — LACTATED RINGERS IV SOLN: INTRAVENOUS | Status: AC

## 2022-06-11 MED ORDER — LIP MEDEX EX OINT
TOPICAL_OINTMENT | Freq: Two times a day (BID) | CUTANEOUS | Status: DC
  Administered 2022-06-11: 1 via TOPICAL
  Administered 2022-06-12: 75 via TOPICAL
  Filled 2022-06-11 (×3): qty 7

## 2022-06-11 MED ORDER — SODIUM CHLORIDE 0.9 % IV SOLN
2.0000 g | INTRAVENOUS | Status: DC
  Administered 2022-06-11 – 2022-06-12 (×2): 2 g via INTRAVENOUS
  Filled 2022-06-11 (×2): qty 20

## 2022-06-11 MED ORDER — MIDAZOLAM HCL 2 MG/2ML IJ SOLN
INTRAMUSCULAR | Status: AC
  Filled 2022-06-11: qty 2

## 2022-06-11 MED ORDER — AMLODIPINE-VALSARTAN-HCTZ 5-160-12.5 MG PO TABS: 1.0000 | ORAL_TABLET | Freq: Every day | ORAL | Status: DC

## 2022-06-11 MED ORDER — HYDROMORPHONE HCL 1 MG/ML IJ SOLN
0.5000 mg | INTRAMUSCULAR | Status: DC | PRN
  Administered 2022-06-11 (×2): 1 mg via INTRAVENOUS
  Filled 2022-06-11 (×2): qty 1

## 2022-06-11 MED ORDER — PIPERACILLIN-TAZOBACTAM 3.375 G IVPB 30 MIN: 3.3750 g | Freq: Once | INTRAVENOUS | Status: DC

## 2022-06-11 MED ORDER — PROCHLORPERAZINE EDISYLATE 10 MG/2ML IJ SOLN
5.0000 mg | INTRAMUSCULAR | Status: DC | PRN
  Administered 2022-06-11: 10 mg via INTRAVENOUS
  Filled 2022-06-11: qty 2

## 2022-06-11 MED ORDER — ONDANSETRON HCL 4 MG/2ML IJ SOLN
INTRAMUSCULAR | Status: AC
  Filled 2022-06-11: qty 2

## 2022-06-11 MED ORDER — KETOROLAC TROMETHAMINE 30 MG/ML IJ SOLN: 30.0000 mg | Freq: Once | INTRAMUSCULAR | Status: DC | PRN

## 2022-06-11 MED ORDER — SIMETHICONE 40 MG/0.6ML PO SUSP: 80.0000 mg | Freq: Four times a day (QID) | ORAL | Status: DC | PRN

## 2022-06-11 MED ORDER — BUPIVACAINE-EPINEPHRINE 0.25% -1:200000 IJ SOLN
INTRAMUSCULAR | Status: DC | PRN
  Administered 2022-06-11: 30 mL

## 2022-06-11 MED ORDER — GABAPENTIN 300 MG PO CAPS
300.0000 mg | ORAL_CAPSULE | Freq: Two times a day (BID) | ORAL | Status: DC
  Administered 2022-06-11 – 2022-06-13 (×4): 300 mg via ORAL
  Filled 2022-06-11 (×4): qty 1

## 2022-06-11 MED ORDER — DEXAMETHASONE SODIUM PHOSPHATE 10 MG/ML IJ SOLN
INTRAMUSCULAR | Status: DC | PRN
  Administered 2022-06-11: 5 mg via INTRAVENOUS

## 2022-06-11 MED ORDER — BUPIVACAINE-EPINEPHRINE (PF) 0.25% -1:200000 IJ SOLN
INTRAMUSCULAR | Status: AC
  Filled 2022-06-11: qty 30

## 2022-06-11 MED ORDER — CELECOXIB 200 MG PO CAPS: 200.0000 mg | ORAL_CAPSULE | ORAL | Status: AC

## 2022-06-11 MED ORDER — GABAPENTIN 300 MG PO CAPS: 300.0000 mg | ORAL_CAPSULE | ORAL | Status: AC

## 2022-06-11 MED ORDER — OXYCODONE HCL 5 MG/5ML PO SOLN: 5.0000 mg | Freq: Once | ORAL | Status: DC | PRN

## 2022-06-11 SURGICAL SUPPLY — 53 items
APPLIER CLIP 5 13 M/L LIGAMAX5 (MISCELLANEOUS)
APPLIER CLIP ROT 10 11.4 M/L (STAPLE)
BAG COUNTER SPONGE SURGICOUNT (BAG) IMPLANT
CABLE HIGH FREQUENCY MONO STRZ (ELECTRODE) ×1 IMPLANT
CHLORAPREP W/TINT 26 (MISCELLANEOUS) ×1 IMPLANT
CLIP APPLIE 5 13 M/L LIGAMAX5 (MISCELLANEOUS) IMPLANT
CLIP APPLIE ROT 10 11.4 M/L (STAPLE) IMPLANT
COVER SURGICAL LIGHT HANDLE (MISCELLANEOUS) ×1 IMPLANT
DEVICE TROCAR PUNCTURE CLOSURE (ENDOMECHANICALS) IMPLANT
DRAPE LAPAROSCOPIC ABDOMINAL (DRAPES) ×1 IMPLANT
DRAPE WARM FLUID 44X44 (DRAPES) ×1 IMPLANT
DRSG TEGADERM 2-3/8X2-3/4 SM (GAUZE/BANDAGES/DRESSINGS) ×2 IMPLANT
DRSG TEGADERM 4X4.75 (GAUZE/BANDAGES/DRESSINGS) ×1 IMPLANT
ELECT REM PT RETURN 15FT ADLT (MISCELLANEOUS) ×1 IMPLANT
ENDOLOOP SUT PDS II  0 18 (SUTURE)
ENDOLOOP SUT PDS II 0 18 (SUTURE) IMPLANT
GAUZE SPONGE 2X2 8PLY STRL LF (GAUZE/BANDAGES/DRESSINGS) ×1 IMPLANT
GLOVE ECLIPSE 8.0 STRL XLNG CF (GLOVE) ×1 IMPLANT
GLOVE INDICATOR 8.0 STRL GRN (GLOVE) ×1 IMPLANT
GOWN STRL REUS W/ TWL XL LVL3 (GOWN DISPOSABLE) ×3 IMPLANT
GOWN STRL REUS W/TWL XL LVL3 (GOWN DISPOSABLE) ×3
IRRIG SUCT STRYKERFLOW 2 WTIP (MISCELLANEOUS) ×1
IRRIGATION SUCT STRKRFLW 2 WTP (MISCELLANEOUS) ×1 IMPLANT
KIT BASIN OR (CUSTOM PROCEDURE TRAY) ×1 IMPLANT
KIT TURNOVER KIT A (KITS) IMPLANT
PAD POSITIONING PINK XL (MISCELLANEOUS) ×1 IMPLANT
PENCIL SMOKE EVACUATOR (MISCELLANEOUS) IMPLANT
POUCH RETRIEVAL ECOSAC 10 (ENDOMECHANICALS) ×1 IMPLANT
POUCH RETRIEVAL ECOSAC 10MM (ENDOMECHANICALS) ×1
RELOAD STAPLE 60 3.6 BLU REG (STAPLE) IMPLANT
RELOAD STAPLE 60 4.1 GRN THCK (STAPLE) IMPLANT
RELOAD STAPLER BLUE 60MM (STAPLE) ×1 IMPLANT
RELOAD STAPLER GREEN 60MM (STAPLE) IMPLANT
SCISSORS LAP 5X35 DISP (ENDOMECHANICALS) ×1 IMPLANT
SEALER TISSUE G2 STRG ARTC 35C (ENDOMECHANICALS) ×1 IMPLANT
SET TUBE SMOKE EVAC HIGH FLOW (TUBING) ×1 IMPLANT
SLEEVE Z-THREAD 5X100MM (TROCAR) ×1 IMPLANT
SPIKE FLUID TRANSFER (MISCELLANEOUS) ×1 IMPLANT
SPONGE GAUZE 2X2 8PLY STRL LF (GAUZE/BANDAGES/DRESSINGS) IMPLANT
STAPLE ECHEON FLEX 60 POW ENDO (STAPLE) IMPLANT
STAPLER ECHELON LONG 60 440 (INSTRUMENTS) IMPLANT
STAPLER RELOAD BLUE 60MM (STAPLE) ×1
STAPLER RELOAD GREEN 60MM (STAPLE)
SUT MNCRL AB 4-0 PS2 18 (SUTURE) ×1 IMPLANT
SUT PDS AB 0 CT1 36 (SUTURE) IMPLANT
SUT PDS AB 1 CT1 27 (SUTURE) IMPLANT
SUT SILK 2 0 SH (SUTURE) IMPLANT
SUT VICRYL 0 UR6 27IN ABS (SUTURE) IMPLANT
TOWEL OR 17X26 10 PK STRL BLUE (TOWEL DISPOSABLE) ×1 IMPLANT
TRAY FOLEY MTR SLVR 16FR STAT (SET/KITS/TRAYS/PACK) IMPLANT
TRAY LAPAROSCOPIC (CUSTOM PROCEDURE TRAY) ×1 IMPLANT
TROCAR ADV FIXATION 12X100MM (TROCAR) ×1 IMPLANT
TROCAR Z-THREAD OPTICAL 5X100M (TROCAR) ×1 IMPLANT

## 2022-06-11 NOTE — H&P (Signed)
Jeremiah [name]  08-Feb-1981 [id number]  CARE TEAM:  PCP: Doran Stabler, DO  Outpatient Care Team: Patient Care Team: Doran Stabler, DO as PCP - General (Internal Medicine)  Inpatient Treatment Team: Treatment Team: Attending Provider: Sabas Sous, MD; Technician: Jamie Brookes, EMT; Physician Assistant: Oletha Blend; Registered Nurse: Electa Sniff, RN; Consulting Physician: Montez Morita, Md, MD   This patient is a 41 y.o.male who presents today for surgical evaluation at the request of Ileene Hutchinson, Cordelia Poche, [facility] ED.   Chief complaint / Reason for evaluation: Appendicitis  41 year old male originally from [place].  Speaks some English.  Noticed worsening abdominal pain for the past day or so with decreased appetite.  Worsening nausea.  Pain became crampy.  Felt pressure and difficulty urinating.  Concerned.  Patient has history of kidney stones with prior renal failure and atrophic left kidney.  Tried to manage it with over-the-counter pain medications but felt worse.  Came to emergency department.  History physical examination concerning and CT scan strongly suspicious for appendicitis.  Surgical consultation requested.    History of kidney stones but this does not seem like that.  X-ray negative for that workout.  No hematuria.  He has never had any abdominal surgery.  Denies any sick contacts no travel history.  No history of Crohn's or inflammatory bowel disease.   Assessment  Jeremiah [name]  41 y.o. male       Problem List:  Principal Problem:   Acute appendicitis Active Problems:   Hypertension   Hypokalemia   History of kidney stones   History of acute renal failure   Atrophy of left kidney   Incomplete right bundle branch block   Nephrolithiasis   Non-English speaking patient ([place])   Probable acute appendicitis  Plan:  IV resuscitation.  Correct hypokalemia.  Magnesium checked and appears to be stable.  Nausea and pain  control.  IV antibiotics.  Start with ceftriaxone/metronidazole since does not seem to be too complex.  Diagnostic laparoscopy with appendectomy. The anatomy & physiology of the digestive tract was discussed.  The pathophysiology of appendicitis and other appendiceal disorders were discussed.  Natural history risks without surgery was discussed.   I feel the risks of no intervention will lead to serious problems that outweigh the operative risks; therefore, I recommended diagnostic laparoscopy with removal of appendix to remove the pathology.  Laparoscopic & open techniques were discussed.   I noted a good likelihood this will help address the problem.   Risks such as bleeding, infection, abscess, leak, reoperation, injury to other organs, need for repair of tissues / organs, possible ostomy, hernia, heart attack, stroke, death, and other risks were discussed.  Goals of post-operative recovery were discussed as well.  We will work to minimize complications.  Questions were answered.  The patient expresses understanding & wishes to proceed with surgery.   -VTE prophylaxis- SCDs, etc -mobilize as tolerated to help recovery  I reviewed nursing notes, ED provider notes, last 24 h vitals and pain scores, last 48 h intake and output, last 24 h labs and trends, and last 24 h imaging results. I have reviewed this patient's available data, including medical history, events of note, test results, etc as part of my evaluation.  A significant portion of that time was spent in counseling.  Care during the described time interval was provided by me.  This care required straight-forward level of medical decision making.  06/11/2022  Salvatore Decent.  Nic Lampe, MD, FACS, MASCRS Esophageal, Gastrointestinal & Colorectal Surgery Robotic and Minimally Invasive Surgery  [facility] [address] [place], [place] [address] [phone] Fax [phone]  Main  CONTACT INFORMATION:  Weekday (9AM-5PM): Call [facility] main office at [phone]  Weeknight (5PM-9AM) or Weekend/Holiday: Check [email or web address] (password " [email or web address]") for General Surgery [facility] coverage  (Please, do not use SecureChat as it is not reliable communication to reach operating surgeons for immediate patient care)      06/11/2022      Past Medical History:  Diagnosis Date   Atrophy of left kidney    History of acute renal failure    ADX 12-30-2014   History of kidney stones 12/2013   Hypertension    Incomplete right bundle branch block    Nephrolithiasis    BILATERAL --- PER CT  03-13-2014    Right ureteral stone     Past Surgical History:  Procedure Laterality Date   CYSTO/  LEFT RETROGRADE PYELOGRAM/  LEFT URETERAL STENT PLACEMENT  10-17-2010   CYSTOSCOPY W/ URETERAL STENT PLACEMENT Right 12/30/2014   Procedure: CYSTOSCOPY WITH RIGHT RETROGRADE PYELOGRAM/ RIGHT URETERAL STENT PLACEMENT;  Surgeon: Sebastian Ache, MD;  Location: [facility];  Service: Urology;  Laterality: Right;   EXTRACORPOREAL SHOCK WAVE LITHOTRIPSY Right 02-02-2014   NEPHROLITHOTOMY Left 11-21-2010   STONE EXTRACTION WITH BASKET Right 01/20/2015   Procedure: STONE EXTRACTION WITH BASKET;  Surgeon: Barron Alvine, MD;  Location: [facility];  Service: Urology;  Laterality: Right;    Social History   Socioeconomic History   Marital status: Married    Spouse name: Not on file   Number of children: Not on file   Years of education: Not on file   Highest education level: Not on file  Occupational History   Not on file  Tobacco Use   Smoking status: Never   Smokeless tobacco: Never  Substance and Sexual Activity   Alcohol use: No   Drug use: No   Sexual activity: Not on file  Other Topics Concern   Not on file  Social History Narrative   [place]    Social Determinants of Health   Financial Resource Strain: Not on file  Food Insecurity: Not on file  Transportation  Needs: Not on file  Physical Activity: Not on file  Stress: Not on file  Social Connections: Not on file  Intimate Partner Violence: Not on file    History reviewed. No pertinent family history.  Current Facility-Administered Medications  Medication Dose Route Frequency Provider Last Rate Last Admin   acetaminophen (TYLENOL) tablet 1,000 mg  1,000 mg Oral On Call to OR Karie Soda, MD       albuterol (PROVENTIL) (2.5 MG/3ML) 0.083% nebulizer solution 2.5 mg  2.5 mg Nebulization Q6H PRN Karie Soda, MD       bupivacaine liposome (EXPAREL) 1.3 % injection 266 mg  20 mL Infiltration Once Karie Soda, MD       cefTRIAXone (ROCEPHIN) 2 g in sodium chloride 0.9 % 100 mL IVPB  2 g Intravenous Q24H Karie Soda, MD       celecoxib (CELEBREX) capsule 200 mg  200 mg Oral On Call to OR Karie Soda, MD       Chlorhexidine Gluconate Cloth 2 % PADS 6 each  6 each Topical Once Karie Soda, MD       And   Chlorhexidine Gluconate Cloth 2 % PADS 6 each  6  each Topical Once Karie Soda, MD       diphenhydrAMINE (BENADRYL) injection 12.5-25 mg  12.5-25 mg Intravenous Q6H PRN Karie Soda, MD       enalaprilat (VASOTEC) injection 0.625-1.25 mg  0.625-1.25 mg Intravenous Q6H PRN Karie Soda, MD       gabapentin (NEURONTIN) capsule 300 mg  300 mg Oral On Call to OR Karie Soda, MD       HYDROmorphone (DILAUDID) injection 0.5-2 mg  0.5-2 mg Intravenous Q2H PRN Karie Soda, MD       HYDROmorphone (DILAUDID) injection 1 mg  1 mg Intravenous Once Sharilyn Sites M, PA-C       lactated ringers bolus 1,000 mL  1,000 mL Intravenous Once Karie Soda, MD       lactated ringers bolus 1,000 mL  1,000 mL Intravenous Q8H PRN Karie Soda, MD       lip balm (CARMEX) ointment   Topical BID Karie Soda, MD       magic mouthwash  15 mL Oral QID PRN Karie Soda, MD       methocarbamol (ROBAXIN) 1,000 mg in dextrose 5 % 100 mL IVPB  1,000 mg Intravenous Q6H PRN Karie Soda, MD       metoprolol tartrate  (LOPRESSOR) injection 5 mg  5 mg Intravenous Q6H PRN Karie Soda, MD       metoprolol tartrate (LOPRESSOR) tablet 12.5 mg  12.5 mg Oral Q12H PRN Karie Soda, MD       metroNIDAZOLE (FLAGYL) IVPB 500 mg  500 mg Intravenous Catha Gosselin, MD       ondansetron [facility]) injection 4 mg  4 mg Intravenous Q6H PRN Karie Soda, MD       Or   ondansetron (ZOFRAN) 8 mg in sodium chloride 0.9 % 50 mL IVPB  8 mg Intravenous Q6H PRN Karie Soda, MD       ondansetron (ZOFRAN-ODT) disintegrating tablet 4-8 mg  4-8 mg Oral Q6H PRN Karie Soda, MD       potassium chloride 10 mEq in 100 mL IVPB  10 mEq Intravenous Q1 Hr x 4 Colene Mines, Viviann Spare, MD       prochlorperazine (COMPAZINE) injection 5-10 mg  5-10 mg Intravenous Q4H PRN Karie Soda, MD       Current Outpatient Medications  Medication Sig Dispense Refill   amLODIPine-Valsartan-HCTZ 5-160-12.5 MG TABS Take 1 tablet by mouth daily in the afternoon. 90 tablet 1     Allergies  Allergen Reactions   No Known Allergies     ROS:   All other systems reviewed & are negative except per HPI or as noted below: Constitutional:  No fevers, chills, sweats.  Weight stable Eyes:  No vision changes, No discharge HENT:  No sore throats, nasal drainage Lymph: No neck swelling, No bruising easily Pulmonary:  No cough, productive sputum CV: No orthopnea, PND  Patient walks 20 minutes without difficulty.  No exertional chest/neck/shoulder/arm pain.  GI:  No personal nor family history of GI/colon cancer, inflammatory bowel disease, irritable bowel syndrome, allergy such as Celiac Sprue, dietary/dairy problems, colitis, ulcers nor gastritis.  No recent sick contacts/gastroenteritis.  No travel outside the country.  No changes in diet.  Renal: No UTIs, No hematuria Genital:  No drainage, bleeding, masses Musculoskeletal: No severe joint pain.  Good ROM major joints Skin:  No sores or lesions Heme/Lymph:  No easy bleeding.  No swollen lymph nodes   BP (!)  161/93   Pulse 78   Temp 98.1 F (  36.7 C) (Oral)   Resp 17   SpO2 100%   Physical Exam:  Constitutional: Not cachectic.  Hygeine adequate.  Vitals signs as above.  Well-developed well-nourished.  Obviously uncomfortable but consolable. Eyes: Pupils reactive, normal extraocular movements. Sclera nonicteric Neuro: CN II-XII intact.  No major focal sensory defects.  No major motor deficits. Lymph: No head/neck/groin lymphadenopathy Psych:  No severe agitation.  No severe anxiety.  Judgment & insight Adequate, Oriented x4, HENT: Normocephalic, Mucus membranes moist.  No thrush.   Neck: Supple, No tracheal deviation.  No obvious thyromegaly Chest: No pain to chest wall compression.  Good respiratory excursion.  No audible wheezing CV:  Pulses intact.  regular rhythm.  No major extremity edema  Abdomen:  Flat Hernia: Not present. Diastasis recti: Not present. Somewhat firm.   Mildly distended.  Tenderness at right lower quadrant with involuntary guarding at McBurney's point especially.  No tenderness on left side. .  No hepatomegaly.  No splenomegaly  Gen:  Inguinal hernia: Not present.  Inguinal lymph nodes: without lymphadenopathy.    Rectal: (Deferred)  Ext: No obvious deformity or contracture.  Edema: Not present.  No cyanosis Skin: No major subcutaneous nodules.  Warm and dry Musculoskeletal: Severe joint rigidity not present.  No obvious clubbing.  No digital petechiae.     Results:   Labs: Results for orders placed or performed during the hospital encounter of 06/10/22 (from the past 48 hour(s))  Lipase, blood     Status: None   Collection Time: 06/10/22 11:53 PM  Result Value Ref Range   Lipase 32 11 - 51 U/L    Comment: Performed at [facility], [address] [place], [place] [address]  Comprehensive metabolic panel     Status: Abnormal   Collection Time: 06/10/22 11:53 PM  Result Value Ref Range   Sodium 136 135 - 145 mmol/L   Potassium 3.0 (L) 3.5  - 5.1 mmol/L   Chloride 103 98 - 111 mmol/L   CO2 24 22 - 32 mmol/L   Glucose, Bld 104 (H) 70 - 99 mg/dL    Comment: Glucose reference range applies only to samples taken after fasting for at least 8 hours.   BUN 17 6 - 20 mg/dL   Creatinine, Ser 6.041.21 0.61 - 1.24 mg/dL   Calcium 9.0 8.9 - 54.010.3 mg/dL   Total Protein 7.9 6.5 - 8.1 g/dL   Albumin 4.5 3.5 - 5.0 g/dL   AST 25 15 - 41 U/L   ALT 16 0 - 44 U/L   Alkaline Phosphatase 52 38 - 126 U/L   Total Bilirubin 1.3 (H) 0.3 - 1.2 mg/dL   GFR, Estimated >98>60 >11>60 mL/min    Comment: (NOTE) Calculated using the CKD-EPI Creatinine Equation (2021)    Anion gap 9 5 - 15    Comment: Performed at [facility], [address] [place], [place] [address]  CBC     Status: Abnormal   Collection Time: 06/10/22 11:53 PM  Result Value Ref Range   WBC 18.6 (H) 4.0 - 10.5 K/uL   RBC 5.98 (H) 4.22 - 5.81 MIL/uL   Hemoglobin 15.3 13.0 - 17.0 g/dL   HCT 29.544.6 62.139.0 - 30.852.0 %   MCV 74.6 (L) 80.0 - 100.0 fL   MCH 25.6 (L) 26.0 - 34.0 pg   MCHC 34.3 30.0 - 36.0 g/dL   RDW 65.712.7 84.611.5 - 96.215.5 %   Platelets 283 150 - 400 K/uL   nRBC 0.0  0.0 - 0.2 %    Comment: Performed at [facility], [address], [place], [place] [address]    Imaging / Studies: CT ABDOMEN PELVIS W CONTRAST  Result Date: 06/11/2022 CLINICAL DATA:  Abdominal pain. EXAM: CT ABDOMEN AND PELVIS WITH CONTRAST TECHNIQUE: Multidetector CT imaging of the abdomen and pelvis was performed using the standard protocol following bolus administration of intravenous contrast. RADIATION DOSE REDUCTION: This exam was performed according to the departmental dose-optimization program which includes automated exposure control, adjustment of the mA and/or kV according to patient size and/or use of iterative reconstruction technique. CONTRAST:  OMNIPAQUE IOHEXOL 300 MG/ML  SOLN COMPARISON:  CTs without contrast 01/27/2020, 05/06/2015. FINDINGS: Lower chest: Increased  posterior pleural-parenchymal opacities both lower lobes, most likely dependent atelectasis. No pleural effusion or consolidated infiltrate. Normal cardiac size. Hepatobiliary: No focal liver abnormality is seen. No calcified gallstones, gallbladder wall thickening, or biliary dilatation. Pancreas: Unremarkable. Spleen: Unremarkable. Adrenals/Urinary Tract: There is no adrenal mass. Nonobstructing left renal calculi up to 6 mm are again noted, better demonstrated with contrast, with asymmetric patchy cortical scarring and volume loss in the left kidney. This was seen previously. Mild left pyelocaliectasis continues to be noted and is also unchanged, with no ureterectasis or ureteral stones. On the right no stone is seen. There are few tiny cortical hypodensities which too small to characterize. No follow-up imaging is recommended. There is no bladder thickening. Stomach/Bowel: The contracted stomach and unopacified small bowel are unremarkable. A 6 mm stone is again noted in the mid appendix. The more distal appendix has become fluid-filled, swollen, and with surrounding stranding consistent with acute appendicitis with the appendix measuring 9 mm distal to the stone. There is no appendiceal rupture or abscess. The large bowel wall is unremarkable. Vascular/Lymphatic: No significant vascular findings are present. No enlarged abdominal or pelvic lymph nodes. Reproductive: There is no prostatomegaly. Other: There is no free air, free hemorrhage or free fluid. No incarcerated hernia. 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 No acute osseous abnormality. IMPRESSION: 1. Mid appendiceal stone, distal to which there is acute appendicitis without evidence of rupture. 2. Left-sided nephrolithiasis and chronic asymmetric left renal atrophy, with chronic pyelocaliectasis. 3. Chronic L5 pars defects. 4. Discussed over the phone with physician assistant Sharilyn Sites at 2:46 a.m., 06/11/2022. Electronically Signed   By: Almira Bar M.D.   On: 06/11/2022 02:49    Medications / Allergies: per chart  Antibiotics: Anti-infectives (From admission, onward)    Start     Dose/Rate Route Frequency Ordered Stop   06/11/22 0315  cefTRIAXone (ROCEPHIN) 2 g in sodium chloride 0.9 % 100 mL IVPB       Note to Pharmacy: Pharmacy may adjust dosing strength / duration / interval for maximal efficacy   2 g 200 mL/hr over 30 Minutes Intravenous Every 24 hours 06/11/22 0310     06/11/22 0315  metroNIDAZOLE (FLAGYL) IVPB 500 mg        500 mg 100 mL/hr over 60 Minutes Intravenous Every 12 hours 06/11/22 0310     06/11/22 0300  piperacillin-tazobactam (ZOSYN) IVPB 3.375 g  Status:  Discontinued        3.375 g 100 mL/hr over 30 Minutes Intravenous  Once 06/11/22 0251 06/11/22 0310         Note: Portions of this report may have been transcribed using voice recognition software. Every effort was made to ensure accuracy; however, inadvertent computerized transcription  errors may be present.   Any transcriptional errors that result from this process are unintentional.    Ardeth Sportsman, MD, FACS, MASCRS Esophageal, Gastrointestinal & Colorectal Surgery Robotic and Minimally Invasive Surgery  [facility] [address] [place], [place] [address] [phone] Fax [phone] Main  CONTACT INFORMATION:  Weekday (9AM-5PM): Call [facility] main office at [phone]  Weeknight (5PM-9AM) or Weekend/Holiday: Check [email or web address] (password " [email or web address]") for General Surgery [facility] coverage  (Please, do not use SecureChat as it is not reliable communication to reach operating surgeons for immediate patient care)       06/11/2022  3:13 AM

## 2022-06-11 NOTE — Anesthesia Preprocedure Evaluation (Addendum)
Anesthesia Evaluation  Patient identified by MRN, date of birth, ID band Patient awake    Reviewed: Allergy & Precautions, NPO status , Patient's Chart, lab work & pertinent test results  Airway Mallampati: III  TM Distance: <3 FB Neck ROM: Full    Dental no notable dental hx.    Pulmonary neg pulmonary ROS,    Pulmonary exam normal breath sounds clear to auscultation       Cardiovascular hypertension, Pt. on medications Normal cardiovascular exam Rhythm:Regular Rate:Normal     Neuro/Psych negative neurological ROS  negative psych ROS   GI/Hepatic negative GI ROS, Neg liver ROS,   Endo/Other  negative endocrine ROS  Renal/GU negative Renal ROS  negative genitourinary   Musculoskeletal negative musculoskeletal ROS (+)   Abdominal   Peds negative pediatric ROS (+)  Hematology negative hematology ROS (+)   Anesthesia Other Findings   Reproductive/Obstetrics negative OB ROS                            Anesthesia Physical Anesthesia Plan  ASA: 2 and emergent  Anesthesia Plan: General   Post-op Pain Management:    Induction: Intravenous and Rapid sequence  PONV Risk Score and Plan: 2 and Ondansetron, Dexamethasone and Treatment may vary due to age or medical condition  Airway Management Planned: Oral ETT  Additional Equipment:   Intra-op Plan:   Post-operative Plan: Extubation in OR  Informed Consent: I have reviewed the patients History and Physical, chart, labs and discussed the procedure including the risks, benefits and alternatives for the proposed anesthesia with the patient or authorized representative who has indicated his/her understanding and acceptance.     Dental advisory given  Plan Discussed with: CRNA and Surgeon  Anesthesia Plan Comments:         Anesthesia Quick Evaluation

## 2022-06-11 NOTE — Anesthesia Postprocedure Evaluation (Signed)
Anesthesia Post Note  Patient: Jeremiah [name]  Procedure(s) Performed: APPENDECTOMY LAPAROSCOPIC (Abdomen)     Patient location during evaluation: PACU Anesthesia Type: General Level of consciousness: awake and alert Pain management: pain level controlled Vital Signs Assessment: post-procedure vital signs reviewed and stable Respiratory status: spontaneous breathing, nonlabored ventilation, respiratory function stable and patient connected to nasal cannula oxygen Cardiovascular status: blood pressure returned to baseline and stable Postop Assessment: no apparent nausea or vomiting Anesthetic complications: no   No notable events documented.  Last Vitals:  Vitals:   06/11/22 1209 06/11/22 1215  BP: (!) 152/93 (!) 158/88  Pulse: 84 77  Resp: (!) 9 14  Temp: 36.7 C   SpO2: 100% 100%    Last Pain:  Vitals:   06/11/22 1209  TempSrc:   PainSc: Asleep                 Dylan Monforte S

## 2022-06-11 NOTE — Transfer of Care (Signed)
Immediate Anesthesia Transfer of Care Note  Patient: Jeremiah [name]  Procedure(s) Performed: APPENDECTOMY LAPAROSCOPIC (Abdomen)  Patient Location: PACU  Anesthesia Type:General  Level of Consciousness: awake and patient cooperative  Airway & Oxygen Therapy: Patient Spontanous Breathing and Patient connected to face mask oxygen  Post-op Assessment: Report given to RN and Post -op Vital signs reviewed and stable  Post vital signs: Reviewed and stable  Last Vitals:  Vitals Value Taken Time  BP 152/93 06/11/22 1209  Temp    Pulse 91 06/11/22 1213  Resp 6 06/11/22 1213  SpO2 100 % 06/11/22 1213  Vitals shown include unvalidated device data.  Last Pain:  Vitals:   06/11/22 0916  TempSrc:   PainSc: 8          Complications: No notable events documented.

## 2022-06-11 NOTE — Interval H&P Note (Signed)
History and Physical Interval Note:  06/11/2022 7:01 AM  Jeremiah [name] Height  has presented today for surgery, with the diagnosis of appendicitis.  The various methods of treatment have been discussed with the patient and family. After consideration of risks, benefits and other options for treatment, the patient has consented to  Procedure(s): APPENDECTOMY LAPAROSCOPIC (N/A) as a surgical intervention.  The patient's history has been reviewed, patient examined, no change in status, stable for surgery.  I have reviewed the patient's chart and labs.  Questions were answered to the patient's satisfaction.    I have re-reviewed the the patient's records, history, medications, and allergies.  I have re-examined the patient.  I again discussed intraoperative plans and goals of post-operative recovery.  The patient agrees to proceed.  Jeremiah [name]  September 15, 1981 [id number]  Patient Care Team: Doran Stabler, DO as PCP - General (Internal Medicine)  Patient Active Problem List   Diagnosis Date Noted   Hypokalemia 06/11/2022   Acute appendicitis 06/11/2022   History of acute renal failure 06/11/2022   Atrophy of left kidney 06/11/2022   Incomplete right bundle branch block 06/11/2022   Nephrolithiasis 06/11/2022   Non-English speaking patient ([place]) 06/11/2022   Need for immunization against influenza 07/31/2021   Hyperlipidemia 11/11/2020   Atypical chest pain 11/11/2020   Hypertension 07/08/2020   History of kidney stones 12/31/2013    Past Medical History:  Diagnosis Date   Atrophy of left kidney    History of acute renal failure    ADX 12-30-2014   History of kidney stones 12/2013   Hypertension    Incomplete right bundle branch block    Nephrolithiasis    BILATERAL --- PER CT  03-13-2014    Right ureteral stone     Past Surgical History:  Procedure Laterality Date   CYSTO/  LEFT RETROGRADE PYELOGRAM/  LEFT URETERAL STENT PLACEMENT  10-17-2010   CYSTOSCOPY W/ URETERAL STENT PLACEMENT  Right 12/30/2014   Procedure: CYSTOSCOPY WITH RIGHT RETROGRADE PYELOGRAM/ RIGHT URETERAL STENT PLACEMENT;  Surgeon: Sebastian Ache, MD;  Location: [facility];  Service: Urology;  Laterality: Right;   EXTRACORPOREAL SHOCK WAVE LITHOTRIPSY Right 02-02-2014   NEPHROLITHOTOMY Left 11-21-2010   STONE EXTRACTION WITH BASKET Right 01/20/2015   Procedure: STONE EXTRACTION WITH BASKET;  Surgeon: Barron Alvine, MD;  Location: [facility];  Service: Urology;  Laterality: Right;    Social History   Socioeconomic History   Marital status: Married    Spouse name: Not on file   Number of children: Not on file   Years of education: Not on file   Highest education level: Not on file  Occupational History   Not on file  Tobacco Use   Smoking status: Never   Smokeless tobacco: Never  Substance and Sexual Activity   Alcohol use: No   Drug use: No   Sexual activity: Not on file  Other Topics Concern   Not on file  Social History Narrative   [place]    Social Determinants of Health   Financial Resource Strain: Not on file  Food Insecurity: Not on file  Transportation Needs: Not on file  Physical Activity: Not on file  Stress: Not on file  Social Connections: Not on file  Intimate Partner Violence: Not on file    History reviewed. No pertinent family history.  (Not in a hospital admission)   Current Facility-Administered Medications  Medication Dose Route Frequency Provider Last Rate Last Admin   acetaminophen (TYLENOL) tablet 1,000 mg  1,000 mg Oral On Call to OR Karie Soda, MD       albuterol (PROVENTIL) (2.5 MG/3ML) 0.083% nebulizer solution 2.5 mg  2.5 mg Nebulization Q6H PRN Karie Soda, MD       bupivacaine liposome (EXPAREL) 1.3 % injection 266 mg  20 mL Infiltration Once Karie Soda, MD       cefTRIAXone (ROCEPHIN) 2 g in sodium chloride 0.9 % 100 mL IVPB  2 g Intravenous Q24H Karie Soda, MD 200 mL/hr at 06/11/22 0528 2 g at 06/11/22 7341   celecoxib  (CELEBREX) capsule 200 mg  200 mg Oral On Call to OR Karie Soda, MD       Chlorhexidine Gluconate Cloth 2 % PADS 6 each  6 each Topical Once Karie Soda, MD       And   Chlorhexidine Gluconate Cloth 2 % PADS 6 each  6 each Topical Once Karie Soda, MD       diphenhydrAMINE (BENADRYL) injection 12.5-25 mg  12.5-25 mg Intravenous Q6H PRN Karie Soda, MD       enalaprilat (VASOTEC) injection 0.625-1.25 mg  0.625-1.25 mg Intravenous Q6H PRN Karie Soda, MD       gabapentin (NEURONTIN) capsule 300 mg  300 mg Oral On Call to OR Karie Soda, MD       HYDROmorphone (DILAUDID) injection 0.5-2 mg  0.5-2 mg Intravenous Q2H PRN Karie Soda, MD       lactated ringers bolus 1,000 mL  1,000 mL Intravenous Q8H PRN Luken Shadowens, Viviann Spare, MD       lip balm (CARMEX) ointment   Topical BID Karie Soda, MD       magic mouthwash  15 mL Oral QID PRN Karie Soda, MD       methocarbamol (ROBAXIN) 1,000 mg in dextrose 5 % 100 mL IVPB  1,000 mg Intravenous Q6H PRN Karie Soda, MD       metoprolol tartrate (LOPRESSOR) injection 5 mg  5 mg Intravenous Q6H PRN Karie Soda, MD   5 mg at 06/11/22 0430   metoprolol tartrate (LOPRESSOR) tablet 12.5 mg  12.5 mg Oral Q12H PRN Karie Soda, MD       metroNIDAZOLE (FLAGYL) IVPB 500 mg  500 mg Intravenous Catha Gosselin, MD 100 mL/hr at 06/11/22 0428 500 mg at 06/11/22 0428   ondansetron (ZOFRAN) injection 4 mg  4 mg Intravenous Q6H PRN Karie Soda, MD       Or   ondansetron (ZOFRAN) 8 mg in sodium chloride 0.9 % 50 mL IVPB  8 mg Intravenous Q6H PRN Karie Soda, MD       ondansetron (ZOFRAN-ODT) disintegrating tablet 4-8 mg  4-8 mg Oral Q6H PRN Karie Soda, MD       potassium chloride 10 mEq in 100 mL IVPB  10 mEq Intravenous Q1 Hr x 4 Janese Radabaugh, Viviann Spare, MD 100 mL/hr at 06/11/22 0642 10 mEq at 06/11/22 9379   prochlorperazine (COMPAZINE) injection 5-10 mg  5-10 mg Intravenous Q4H PRN Karie Soda, MD       Current Outpatient Medications  Medication Sig  Dispense Refill   acetaminophen (TYLENOL) 500 MG tablet Take 500 mg by mouth every 6 (six) hours as needed for moderate pain.     amLODIPine-Valsartan-HCTZ 5-160-12.5 MG TABS Take 1 tablet by mouth daily in the afternoon. (Patient not taking: Reported on 06/11/2022) 90 tablet 1     Allergies  Allergen Reactions   No Known Allergies     BP (!) 147/88   Pulse 75  Temp 98 F (36.7 C) (Oral)   Resp 18   SpO2 96%   Labs: Results for orders placed or performed during the hospital encounter of 06/10/22 (from the past 48 hour(s))  Lipase, blood     Status: None   Collection Time: 06/10/22 11:53 PM  Result Value Ref Range   Lipase 32 11 - 51 U/L    Comment: Performed at [facility], [address] [place], [place] [address]  Comprehensive metabolic panel     Status: Abnormal   Collection Time: 06/10/22 11:53 PM  Result Value Ref Range   Sodium 136 135 - 145 mmol/L   Potassium 3.0 (L) 3.5 - 5.1 mmol/L   Chloride 103 98 - 111 mmol/L   CO2 24 22 - 32 mmol/L   Glucose, Bld 104 (H) 70 - 99 mg/dL    Comment: Glucose reference range applies only to samples taken after fasting for at least 8 hours.   BUN 17 6 - 20 mg/dL   Creatinine, Ser 9.37 0.61 - 1.24 mg/dL   Calcium 9.0 8.9 - 90.2 mg/dL   Total Protein 7.9 6.5 - 8.1 g/dL   Albumin 4.5 3.5 - 5.0 g/dL   AST 25 15 - 41 U/L   ALT 16 0 - 44 U/L   Alkaline Phosphatase 52 38 - 126 U/L   Total Bilirubin 1.3 (H) 0.3 - 1.2 mg/dL   GFR, Estimated >40 >97 mL/min    Comment: (NOTE) Calculated using the CKD-EPI Creatinine Equation (2021)    Anion gap 9 5 - 15    Comment: Performed at [facility], [address], [place], [place] [address]  CBC     Status: Abnormal   Collection Time: 06/10/22 11:53 PM  Result Value Ref Range   WBC 18.6 (H) 4.0 - 10.5 K/uL   RBC 5.98 (H) 4.22 - 5.81 MIL/uL   Hemoglobin 15.3 13.0 - 17.0 g/dL   HCT 92.4 26.8 - 34.1 %   MCV 74.6 (L) 80.0 - 100.0 fL   MCH 25.6 (L) 26.0  - 34.0 pg   MCHC 34.3 30.0 - 36.0 g/dL   RDW 96.2 22.9 - 79.8 %   Platelets 283 150 - 400 K/uL   nRBC 0.0 0.0 - 0.2 %    Comment: Performed at [facility], [address] [place], [place] [address]  Magnesium     Status: None   Collection Time: 06/10/22 11:53 PM  Result Value Ref Range   Magnesium 2.1 1.7 - 2.4 mg/dL    Comment: Performed at [facility], [address], [place], [place] [address]    Imaging / Studies: CT ABDOMEN PELVIS W CONTRAST  Result Date: 06/11/2022 CLINICAL DATA:  Abdominal pain. EXAM: CT ABDOMEN AND PELVIS WITH CONTRAST TECHNIQUE: Multidetector CT imaging of the abdomen and pelvis was performed using the standard protocol following bolus administration of intravenous contrast. RADIATION DOSE REDUCTION: This exam was performed according to the departmental dose-optimization program which includes automated exposure control, adjustment of the mA and/or kV according to patient size and/or use of iterative reconstruction technique. CONTRAST:  OMNIPAQUE IOHEXOL 300 MG/ML  SOLN COMPARISON:  CTs without contrast 01/27/2020, 05/06/2015. FINDINGS: Lower chest: Increased posterior pleural-parenchymal opacities both lower lobes, most likely dependent atelectasis. No pleural effusion or consolidated infiltrate. Normal cardiac size. Hepatobiliary: No focal liver abnormality is seen. No calcified gallstones, gallbladder wall thickening, or biliary dilatation. Pancreas: Unremarkable. Spleen: Unremarkable. Adrenals/Urinary Tract: There is no adrenal  mass. Nonobstructing left renal calculi up to 6 mm are again noted, better demonstrated with contrast, with asymmetric patchy cortical scarring and volume loss in the left kidney. This was seen previously. Mild left pyelocaliectasis continues to be noted and is also unchanged, with no ureterectasis or ureteral stones. On the right no stone is seen. There are few tiny cortical hypodensities which too  small to characterize. No follow-up imaging is recommended. There is no bladder thickening. Stomach/Bowel: The contracted stomach and unopacified small bowel are unremarkable. A 6 mm stone is again noted in the mid appendix. The more distal appendix has become fluid-filled, swollen, and with surrounding stranding consistent with acute appendicitis with the appendix measuring 9 mm distal to the stone. There is no appendiceal rupture or abscess. The large bowel wall is unremarkable. Vascular/Lymphatic: No significant vascular findings are present. No enlarged abdominal or pelvic lymph nodes. Reproductive: There is no prostatomegaly. Other: There is no free air, free hemorrhage or free fluid. No incarcerated hernia. 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 No acute osseous abnormality. IMPRESSION: 1. Mid appendiceal stone, distal to which there is acute appendicitis without evidence of rupture. 2. Left-sided nephrolithiasis and chronic asymmetric left renal atrophy, with chronic pyelocaliectasis. 3. Chronic L5 pars defects. 4. Discussed over the phone with physician assistant Sharilyn Sites at 2:46 a.m., 06/11/2022. Electronically Signed   By: Almira Bar M.D.   On: 06/11/2022 02:49     .Ardeth Sportsman, M.D., F.A.C.S. Gastrointestinal and Minimally Invasive Surgery [facility] [address] [place], [place] [address] [phone] Main / Paging  06/11/2022 7:02 AM    Ardeth Sportsman

## 2022-06-11 NOTE — Discharge Instructions (Signed)
SURGERY: POST OP INSTRUCTIONS (Surgery for small bowel obstruction, colon resection, etc)   ######################################################################  EAT Gradually transition to a high fiber diet with a fiber supplement over the next few days after discharge  WALK Walk an hour a day.  Control your pain to do that.    CONTROL PAIN Control pain so that you can walk, sleep, tolerate sneezing/coughing, go up/down stairs.  HAVE A BOWEL MOVEMENT DAILY Keep your bowels regular to avoid problems.  OK to try a laxative to override constipation.  OK to use an antidairrheal to slow down diarrhea.  Call if not better after 2 tries  CALL IF YOU HAVE PROBLEMS/CONCERNS Call if you are still struggling despite following these instructions. Call if you have concerns not answered by these instructions  ######################################################################   DIET Follow a light diet the first few days at home.  Start with a bland diet such as soups, liquids, starchy foods, low fat foods, etc.  If you feel full, bloated, or constipated, stay on a ful liquid or pureed/blenderized diet for a few days until you feel better and no longer constipated. Be sure to drink plenty of fluids every day to avoid getting dehydrated (feeling dizzy, not urinating, etc.). Gradually add a fiber supplement to your diet over the next week.  Gradually get back to a regular solid diet.  Avoid fast food or heavy meals the first week as you are more likely to get nauseated. It is expected for your digestive tract to need a few months to get back to normal.  It is common for your bowel movements and stools to be irregular.  You will have occasional bloating and cramping that should eventually fade away.  Until you are eating solid food normally, off all pain medications, and back to regular activities; your bowels will not be normal. Focus on eating a low-fat, high fiber diet the rest of your life  (See Getting to Good Bowel Health, below).  CARE of your INCISION or WOUND  It is good for closed incisions and even open wounds to be washed every day.  Shower every day.  Short baths are fine.  Wash the incisions and wounds clean with soap & water.    You may leave closed incisions open to air if it is dry.   You may cover the incision with clean gauze & replace it after your daily shower for comfort.  TEGADERM:  You have clear gauze band-aid dressings over your closed incision(s).  Remove the dressings 3 days after surgery.    If you have an open wound with a wound vac, see wound vac care instructions.    ACTIVITIES as tolerated Start light daily activities --- self-care, walking, climbing stairs-- beginning the day after surgery.  Gradually increase activities as tolerated.  Control your pain to be active.  Stop when you are tired.  Ideally, walk several times a day, eventually an hour a day.   Most people are back to most day-to-day activities in a few weeks.  It takes 4-8 weeks to get back to unrestricted, intense activity. If you can walk 30 minutes without difficulty, it is safe to try more intense activity such as jogging, treadmill, bicycling, low-impact aerobics, swimming, etc. Save the most intensive and strenuous activity for last (Usually 4-8 weeks after surgery) such as sit-ups, heavy lifting, contact sports, etc.  Refrain from any intense heavy lifting or straining until you are off narcotics for pain control.  You will have off days, but   things should improve week-by-week. DO NOT PUSH THROUGH PAIN.  Let pain be your guide: If it hurts to do something, don't do it.  Pain is your body warning you to avoid that activity for another week until the pain goes down. You may drive when you are no longer taking narcotic prescription pain medication, you can comfortably wear a seatbelt, and you can safely make sudden turns/stops to protect yourself without hesitating due to pain. You may  have sexual intercourse when it is comfortable. If it hurts to do something, stop.  MEDICATIONS Take your usually prescribed home medications unless otherwise directed.   Blood thinners:  Usually you can restart any strong blood thinners after the second postoperative day.  It is OK to take aspirin right away.     If you are on strong blood thinners (warfarin/Coumadin, Plavix, Xerelto, Eliquis, Pradaxa, etc), discuss with your surgeon, medicine PCP, and/or cardiologist for instructions on when to restart the blood thinner & if blood monitoring is needed (PT/INR blood check, etc).     PAIN CONTROL Pain after surgery or related to activity is often due to strain/injury to muscle, tendon, nerves and/or incisions.  This pain is usually short-term and will improve in a few months.  To help speed the process of healing and to get back to regular activity more quickly, DO THE FOLLOWING THINGS TOGETHER: Increase activity gradually.  DO NOT PUSH THROUGH PAIN Use Ice and/or Heat Try Gentle Massage and/or Stretching Take over the counter pain medication Take Narcotic prescription pain medication for more severe pain  Good pain control = faster recovery.  It is better to take more medicine to be more active than to stay in bed all day to avoid medications.  Increase activity gradually Avoid heavy lifting at first, then increase to lifting as tolerated over the next 6 weeks. Do not "push through" the pain.  Listen to your body and avoid positions and maneuvers than reproduce the pain.  Wait a few days before trying something more intense Walking an hour a day is encouraged to help your body recover faster and more safely.  Start slowly and stop when getting sore.  If you can walk 30 minutes without stopping or pain, you can try more intense activity (running, jogging, aerobics, cycling, swimming, treadmill, sex, sports, weightlifting, etc.) Remember: If it hurts to do it, then don't do it! Use Ice and/or  Heat You will have swelling and bruising around the incisions.  This will take several weeks to resolve. Ice packs or heating pads (6-8 times a day, 30-60 minutes at a time) will help sooth soreness & bruising. Some people prefer to use ice alone, heat alone, or alternate between ice & heat.  Experiment and see what works best for you.  Consider trying ice for the first few days to help decrease swelling and bruising; then, switch to heat to help relax sore spots and speed recovery. Shower every day.  Short baths are fine.  It feels good!  Keep the incisions and wounds clean with soap & water.   Try Gentle Massage and/or Stretching Massage at the area of pain many times a day Stop if you feel pain - do not overdo it Take over the counter pain medication This helps the muscle and nerve tissues become less irritable and calm down faster Choose ONE of the following over-the-counter anti-inflammatory medications: Acetaminophen 500mg tabs (Tylenol) 1-2 pills with every meal and just before bedtime (avoid if you have liver problems or   if you have acetaminophen in you narcotic prescription) Naproxen 220mg tabs (ex. Aleve, Naprosyn) 1-2 pills twice a day (avoid if you have kidney, stomach, IBD, or bleeding problems) Ibuprofen 200mg tabs (ex. Advil, Motrin) 3-4 pills with every meal and just before bedtime (avoid if you have kidney, stomach, IBD, or bleeding problems) Take with food/snack several times a day as directed for at least 2 weeks to help keep pain / soreness down & more manageable. Take Narcotic prescription pain medication for more severe pain A prescription for strong pain control is often given to you upon discharge (for example: oxycodone/Percocet, hydrocodone/Norco/Vicodin, or tramadol/Ultram) Take your pain medication as prescribed. Be mindful that most narcotic prescriptions contain Tylenol (acetaminophen) as well - avoid taking too much Tylenol. If you are having problems/concerns with  the prescription medicine (does not control pain, nausea, vomiting, rash, itching, etc.), please call us [phone] to see if we need to switch you to a different pain medicine that will work better for you and/or control your side effects better. If you need a refill on your pain medication, you must call the office before 4 pm and on weekdays only.  By federal law, prescriptions for narcotics cannot be called into a pharmacy.  They must be filled out on paper & picked up from our office by the patient or authorized caretaker.  Prescriptions cannot be filled after 4 pm nor on weekends.    WHEN TO CALL US [phone] Severe uncontrolled or worsening pain  Fever over 101 F (38.5 C) Concerns with the incision: Worsening pain, redness, rash/hives, swelling, bleeding, or drainage Reactions / problems with new medications (itching, rash, hives, nausea, etc.) Nausea and/or vomiting Difficulty urinating Difficulty breathing Worsening fatigue, dizziness, lightheadedness, blurred vision Other concerns If you are not getting better after two weeks or are noticing you are getting worse, contact our office [phone] for further advice.  We may need to adjust your medications, re-evaluate you in the office, send you to the emergency room, or see what other things we can do to help. The clinic staff is available to answer your questions during regular business hours (8:30am-5pm).  Please don't hesitate to call and ask to speak to one of our nurses for clinical concerns.    A surgeon from [facility] is always on call at the hospitals 24 hours/day If you have a medical emergency, go to the nearest emergency room or call 911.  FOLLOW UP in our office One the day of your discharge from the hospital (or the next business weekday), please call [facility] to set up or confirm an appointment to see your surgeon in the office for a follow-up appointment.  Usually it is 2-3 weeks  after your surgery.   If you have skin staples at your incision(s), let the office know so we can set up a time in the office for the nurse to remove them (usually around 10 days after surgery). Make sure that you call for appointments the day of discharge (or the next business weekday) from the hospital to ensure a convenient appointment time. IF YOU HAVE DISABILITY OR FAMILY LEAVE FORMS, BRING THEM TO THE OFFICE FOR PROCESSING.  DO NOT GIVE THEM TO YOUR DOCTOR.  [facility] [address], [address], [place], [place]  [address] ? [phone] - Main [phone] - Toll Free,  [phone] - Fax [email or web address]    GETTING TO GOOD BOWEL HEALTH. It is expected for your   digestive tract to need a few months to get back to normal.  It is common for your bowel movements and stools to be irregular.  You will have occasional bloating and cramping that should eventually fade away.  Until you are eating solid food normally, off all pain medications, and back to regular activities; your bowels will not be normal.   Avoiding constipation The goal: ONE SOFT BOWEL MOVEMENT A DAY!    Drink plenty of fluids.  Choose water first. TAKE A FIBER SUPPLEMENT EVERY DAY THE REST OF YOUR LIFE During your first week back home, gradually add back a fiber supplement every day Experiment which form you can tolerate.   There are many forms such as powders, tablets, wafers, gummies, etc Psyllium bran (Metamucil), methylcellulose (Citrucel), Miralax or Glycolax, Benefiber, Flax Seed.  Adjust the dose week-by-week (1/2 dose/day to 6 doses a day) until you are moving your bowels 1-2 times a day.  Cut back the dose or try a different fiber product if it is giving you problems such as diarrhea or bloating. Sometimes a laxative is needed to help jump-start bowels if constipated until the fiber supplement can help regulate your bowels.  If you are tolerating eating & you are farting, it  is okay to try a gentle laxative such as double dose MiraLax, prune juice, or Milk of Magnesia.  Avoid using laxatives too often. Stool softeners can sometimes help counteract the constipating effects of narcotic pain medicines.  It can also cause diarrhea, so avoid using for too long. If you are still constipated despite taking fiber daily, eating solids, and a few doses of laxatives, call our office. Controlling diarrhea Try drinking liquids and eating bland foods for a few days to avoid stressing your intestines further. Avoid dairy products (especially milk & ice cream) for a short time.  The intestines often can lose the ability to digest lactose when stressed. Avoid foods that cause gassiness or bloating.  Typical foods include beans and other legumes, cabbage, broccoli, and dairy foods.  Avoid greasy, spicy, fast foods.  Every person has some sensitivity to other foods, so listen to your body and avoid those foods that trigger problems for you. Probiotics (such as active yogurt, Align, etc) may help repopulate the intestines and colon with normal bacteria and calm down a sensitive digestive tract Adding a fiber supplement gradually can help thicken stools by absorbing excess fluid and retrain the intestines to act more normally.  Slowly increase the dose over a few weeks.  Too much fiber too soon can backfire and cause cramping & bloating. It is okay to try and slow down diarrhea with a few doses of antidiarrheal medicines.   Bismuth subsalicylate (ex. Kayopectate, Pepto Bismol) for a few doses can help control diarrhea.  Avoid if pregnant.   Loperamide (Imodium) can slow down diarrhea.  Start with one tablet (2mg) first.  Avoid if you are having fevers or severe pain.  ILEOSTOMY PATIENTS WILL HAVE CHRONIC DIARRHEA since their colon is not in use.    Drink plenty of liquids.  You will need to drink even more glasses of water/liquid a day to avoid getting dehydrated. Record output from your  ileostomy.  Expect to empty the bag every 3-4 hours at first.  Most people with a permanent ileostomy empty their bag 4-6 times at the least.   Use antidiarrheal medicine (especially Imodium) several times a day to avoid getting dehydrated.  Start with a dose at bedtime & breakfast.    Adjust up or down as needed.  Increase antidiarrheal medications as directed to avoid emptying the bag more than 8 times a day (every 3 hours). Work with your wound ostomy nurse to learn care for your ostomy.  See ostomy care instructions. TROUBLESHOOTING IRREGULAR BOWELS 1) Start with a soft & bland diet. No spicy, greasy, or fried foods.  2) Avoid gluten/wheat or dairy products from diet to see if symptoms improve. 3) Miralax 17gm or flax seed mixed in 8oz. water or juice-daily. May use 2-4 times a day as needed. 4) Gas-X, Phazyme, etc. as needed for gas & bloating.  5) Prilosec (omeprazole) over-the-counter as needed 6)  Consider probiotics (Align, Activa, etc) to help calm the bowels down  Call your doctor if you are getting worse or not getting better.  Sometimes further testing (cultures, endoscopy, X-ray studies, CT scans, bloodwork, etc.) may be needed to help diagnose and treat the cause of the diarrhea. [facility] [address], [address], [place], [place]  [address] [phone] - Main.    [phone]  - Toll Free.   [phone] - Fax [email or web address]  

## 2022-06-11 NOTE — Anesthesia Procedure Notes (Signed)
Procedure Name: Intubation Date/Time: 06/11/2022 10:54 AM  Performed by: West Pugh, CRNAPre-anesthesia Checklist: Patient identified, Emergency Drugs available, Suction available, Patient being monitored and Timeout performed Patient Re-evaluated:Patient Re-evaluated prior to induction Oxygen Delivery Method: Circle system utilized Preoxygenation: Pre-oxygenation with 100% oxygen Induction Type: IV induction, Rapid sequence and Cricoid Pressure applied Ventilation: Mask ventilation without difficulty Laryngoscope Size: Glidescope and 3 Grade View: Grade I Tube type: Oral Tube size: 7.5 mm Number of attempts: 2 Airway Equipment and Method: Rigid stylet and Video-laryngoscopy Placement Confirmation: ETT inserted through vocal cords under direct vision, positive ETCO2, CO2 detector and breath sounds checked- equal and bilateral Secured at: 21 cm Tube secured with: Tape Dental Injury: Teeth and Oropharynx as per pre-operative assessment  Comments: Grade 2b with MAC #3 unsuccessful attempt. Reduced neck mobility and limited oral opening. Glidescope obtained Lopro #3 and grade 1 with successful intubation.

## 2022-06-11 NOTE — Op Note (Signed)
PATIENT:  Jeremiah [name]  41 y.o. male  Patient Care Team: Doran Stabler, DO as PCP - General (Internal Medicine)  PRE-OPERATIVE DIAGNOSIS:  Acute Appendicitis  POST-OPERATIVE DIAGNOSIS:  Acute phlegmonous appendicitis  PROCEDURE: APPENDECTOMY LAPAROSCOPIC  SURGEON:  Ardeth Sportsman, MD  ANESTHESIA:   local and general  EBL:  Total I/O In: -  Out: 400 [Urine:350; Blood:50]  [name] start of Pharmacological VTE agent (>24hrs) due to surgical blood loss or risk of bleeding:  no  DRAINS: none   SPECIMEN:  APPENDIX  DISPOSITION OF SPECIMEN:  PATHOLOGY  COUNTS:  YES  PLAN OF CARE: Admit for overnight observation  PATIENT DISPOSITION:  PACU - hemodynamically stable.   INDICATIONS: Patient with concerning symptoms & work up suspicious for appendicitis.  Surgery was recommended:  The anatomy & physiology of the digestive tract was discussed.  The pathophysiology of appendicitis was discussed.  Natural history risks without surgery was discussed.   I feel the risks of no intervention will lead to serious problems that outweigh the operative risks; therefore, I recommended diagnostic laparoscopy with removal of appendix to remove the pathology.  Laparoscopic & open techniques were discussed.   I noted a good likelihood this will help address the problem.    Risks such as bleeding, infection, abscess, leak, reoperation, possible ostomy, hernia, heart attack, death, and other risks were discussed.  Goals of post-operative recovery were discussed as well.  We will work to minimize complications.  Questions were answered.  The patient expresses understanding & wishes to proceed with surgery.  OR FINDINGS: retroileal appendix  with phlegmon - probable perforation  CASE DATA:  Type of patient?: LDOW CASE (Surgical Hospitalist [facility] Inpatient)  Status of Case? EMERGENT Add On  Infection Present At Time Of Surgery (PATOS)?  PHLEGMON  DESCRIPTION:   The patient was identified & brought into  the operating room. The patient was positioned supine with arms tucked. SCDs were active during the entire case. The patient underwent general anesthesia without any difficulty.  The abdomen was prepped and draped in a sterile fashion. A Surgical Timeout confirmed our plan.   I made a transverse incision through the superior umbilical fold.  I made a small transverse nick through the infraumbilical fascia and confirmed peritoneal entry.  I placed a 46mm port.  We induced carbon dioxide insufflation.  Camera inspection revealed no injury.  I placed additional ports under direct laparoscopic visualization.  Upon entering the abdomen (organ space), I encountered a phlegmon involving the appendix .  I mobilized the terminal ileum to proximal ascending colon in a lateral to medial fashion.  I took care to avoid injuring any retroperitoneal structures.  I freed the appendix off its attachments to the ascending colon and cecal mesentery.  I elevated the appendix. I skeletonized & transected the mesoappendix. I was able to free off the base of the appendix which was still viable.  I stapled the appendix off the cecum using a laparoscopic stapler.  I placed the appendix inside an EcoSac bag and removed out the 50mm stapler port.  I did copious irrigation. Hemostasis was good in the mesoappendix, colon mesentery, and retroperitoneum. Staple line was intact on the cecum with no bleeding. I washed out the pelvis, retrohepatic space and right paracolic gutter. I washed out the left side as well.  Hemostasis is good. There was no perforation or injury. Because the area cleaned up well after irrigation, I did not place a drain.  I closed the 12 mm stapler  port site fascia with 0 Vicryl stitch primarily.   I aspirated the carbon dioxide. Ports removed.  I closed skin using 4-0 monocryl stitch.  Sterile dressings applied.  Patient was extubated and sent to the recovery room.   I suspect the patient is going used in the  hospital at least overnight and will need antibiotics for 5 days.  I tried to reach family by telephone with no answer.  I was able to find family in the waiting room.  I discussed operative findings, updated the patient's status, discussed probable steps to recovery, and gave postoperative recommendations to the patient's family,  Recommendations were made.  Questions were answered.  They expressed understanding & appreciation.  Questions answered. They expressed understanding and appreciation.  Ardeth Sportsman, M.D., F.A.C.S. Gastrointestinal and Minimally Invasive Surgery [facility] [address] [place], [place] [address] [phone] Main / Paging  06/11/2022 12:08 PM

## 2022-06-12 ENCOUNTER — Encounter (HOSPITAL_COMMUNITY): Payer: Self-pay | Admitting: Surgery

## 2022-06-12 MED ORDER — AMOXICILLIN-POT CLAVULANATE 875-125 MG PO TABS
1.0000 | ORAL_TABLET | Freq: Two times a day (BID) | ORAL | Status: DC
  Administered 2022-06-12 – 2022-06-13 (×3): 1 via ORAL
  Filled 2022-06-12 (×3): qty 1

## 2022-06-12 MED ORDER — KCL IN DEXTROSE-NACL 20-5-0.45 MEQ/L-%-% IV SOLN
INTRAVENOUS | Status: DC
  Filled 2022-06-12 (×4): qty 1000

## 2022-06-12 NOTE — Progress Notes (Addendum)
  Subjective No acute events. Some flatus, no BM yet. +Distention, some nausea yesterday, none today. Early after surgery had emesis x1, none since. Tolerating some regular diet which was ordered last night without apparent nausea overnight.  Objective: Vital signs in last 24 hours: Temp:  [97.5 F (36.4 C)-98.4 F (36.9 C)] 98.3 F (36.8 C) (09/11 0531) Pulse Rate:  [68-85] 68 (09/11 0531) Resp:  [9-20] 16 (09/11 0531) BP: (131-177)/(78-107) 131/78 (09/11 0531) SpO2:  [92 %-100 %] 97 % (09/11 0531) Weight:  [71 kg] 71 kg (09/10 1043) Last BM Date : 06/10/22  Intake/Output from previous day: 09/10 0701 - 09/11 0700 In: 3005.2 [P.O.:600; I.V.:1916.7; IV Piggyback:488.5] Out: 700 [Urine:350; Emesis/NG output:300; Blood:50] Intake/Output this shift: No intake/output data recorded.  Gen: NAD, comfortable CV: RRR Pulm: Normal work of breathing Abd: Soft, appropriate incisional tenderness, not significantly distended. Wounds are clean and dry without expansile erythema Ext: SCDs in place  Lab Results: CBC  Recent Labs    06/10/22 2353  WBC 18.6*  HGB 15.3  HCT 44.6  PLT 283   BMET Recent Labs    06/10/22 2353  NA 136  K 3.0*  CL 103  CO2 24  GLUCOSE 104*  BUN 17  CREATININE 1.21  CALCIUM 9.0   PT/INR No results for input(s): "LABPROT", "INR" in the last 72 hours. ABG No results for input(s): "PHART", "HCO3" in the last 72 hours.  Invalid input(s): "PCO2", "PO2"  Studies/Results:  Anti-infectives: Anti-infectives (From admission, onward)    Start     Dose/Rate Route Frequency Ordered Stop   06/11/22 0330  cefTRIAXone (ROCEPHIN) 2 g in sodium chloride 0.9 % 100 mL IVPB       Note to Pharmacy: Pharmacy may adjust dosing strength / duration / interval for maximal efficacy   2 g 200 mL/hr over 30 Minutes Intravenous Every 24 hours 06/11/22 0310     06/11/22 0315  metroNIDAZOLE (FLAGYL) IVPB 500 mg        500 mg 100 mL/hr over 60 Minutes Intravenous Every 12  hours 06/11/22 0310     06/11/22 0300  piperacillin-tazobactam (ZOSYN) IVPB 3.375 g  Status:  Discontinued        3.375 g 100 mL/hr over 30 Minutes Intravenous  Once 06/11/22 0251 06/11/22 0310   06/11/22 0000  amoxicillin-clavulanate (AUGMENTIN) 875-125 MG tablet        1 tablet Oral 2 times daily 06/11/22 1201          Assessment/Plan: Patient Active Problem List   Diagnosis Date Noted   Hypokalemia 06/11/2022   History of acute renal failure 06/11/2022   Atrophy of left kidney 06/11/2022   Incomplete right bundle branch block 06/11/2022   Nephrolithiasis 06/11/2022   Non-English speaking patient ([place]ar[place]) 06/11/2022   Acute phlegmonous appendicitis s/p lap appendectomy 06/11/2022 06/11/2022   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 Michaell Cowing and going over plan moving forward -restart MIVF today, monitor for reliable toleration of diet -ambulate 5x/day -continue postop abx as per Dr. Michaell Cowing whom has recommended 5 day course - will transition to oral today - Augmentin -Ppx: SCDs, lovenox   LOS: 0 days    Marin Olp, MD [facility]

## 2022-06-13 LAB — CBC WITH DIFFERENTIAL/PLATELET
Abs Immature Granulocytes: 0.04 10*3/uL (ref 0.00–0.07)
Basophils Absolute: 0.1 10*3/uL (ref 0.0–0.1)
Basophils Relative: 0 %
Eosinophils Absolute: 0 10*3/uL (ref 0.0–0.5)
Eosinophils Relative: 0 %
HCT: 33.6 % — ABNORMAL LOW (ref 39.0–52.0)
Hemoglobin: 11.4 g/dL — ABNORMAL LOW (ref 13.0–17.0)
Immature Granulocytes: 0 %
Lymphocytes Relative: 22 %
Lymphs Abs: 2.5 10*3/uL (ref 0.7–4.0)
MCH: 25.7 pg — ABNORMAL LOW (ref 26.0–34.0)
MCHC: 33.9 g/dL (ref 30.0–36.0)
MCV: 75.8 fL — ABNORMAL LOW (ref 80.0–100.0)
Monocytes Absolute: 1.2 10*3/uL — ABNORMAL HIGH (ref 0.1–1.0)
Monocytes Relative: 11 %
Neutro Abs: 7.3 10*3/uL (ref 1.7–7.7)
Neutrophils Relative %: 67 %
Platelets: 242 10*3/uL (ref 150–400)
RBC: 4.43 MIL/uL (ref 4.22–5.81)
RDW: 12.6 % (ref 11.5–15.5)
WBC: 11.1 10*3/uL — ABNORMAL HIGH (ref 4.0–10.5)
nRBC: 0 % (ref 0.0–0.2)

## 2022-06-13 LAB — BASIC METABOLIC PANEL
Anion gap: 6 (ref 5–15)
BUN: 15 mg/dL (ref 6–20)
CO2: 26 mmol/L (ref 22–32)
Calcium: 8.3 mg/dL — ABNORMAL LOW (ref 8.9–10.3)
Chloride: 105 mmol/L (ref 98–111)
Creatinine, Ser: 1.39 mg/dL — ABNORMAL HIGH (ref 0.61–1.24)
GFR, Estimated: >60 mL/min (ref >60)
Glucose, Bld: 140 mg/dL — ABNORMAL HIGH (ref 70–99)
Potassium: 3.5 mmol/L (ref 3.5–5.1)
Sodium: 137 mmol/L (ref 135–145)

## 2022-06-13 LAB — SURGICAL PATHOLOGY

## 2022-06-13 MED ORDER — HYDROMORPHONE HCL 1 MG/ML IJ SOLN: 0.5000 mg | INTRAMUSCULAR | Status: DC | PRN

## 2022-06-13 MED ORDER — AMOXICILLIN-POT CLAVULANATE 875-125 MG PO TABS: 1.0000 | ORAL_TABLET | Freq: Two times a day (BID) | ORAL | 0 refills | Status: DC

## 2022-06-13 MED ORDER — TRAMADOL HCL 50 MG PO TABS: 50.0000 mg | ORAL_TABLET | Freq: Four times a day (QID) | ORAL | 0 refills | Status: AC | PRN

## 2022-06-13 NOTE — Discharge Summary (Signed)
[facility] Discharge Summary   Patient ID: Jeremiah [name] MRN: [id number] DOB/AGE: 1981-08-06 41 y.o.  Admit date: 06/10/2022 Discharge date: 06/13/2022  Admitting Diagnosis: Acute appendicitis   Discharge Diagnosis Patient Active Problem List   Diagnosis Date Noted   Hypokalemia 06/11/2022   History of acute renal failure 06/11/2022   Atrophy of left kidney 06/11/2022   Incomplete right bundle branch block 06/11/2022   Nephrolithiasis 06/11/2022   Non-English speaking patient ([place]) 06/11/2022   Acute phlegmonous appendicitis s/p lap appendectomy 06/11/2022 06/11/2022   Fecalith of appendix 06/11/2022   Need for immunization against influenza 07/31/2021   Hyperlipidemia 11/11/2020   Atypical chest pain 11/11/2020   Hypertension 07/08/2020   History of kidney stones 12/31/2013    Consultants None   Imaging: No results found.  Procedures Dr. Karie Soda (06/11/22) - laparoscopic appendectomy   Hospital Course:  41 y/o M who presented with abdominal pain. Workup revealed appendicitis. Patient was admitted and underwent procedure listed above where phlegmonous appendicitis was found. Tolerated procedure well and was transferred to the floor.  Diet was advanced as tolerated.  On POD#2, the patient was voiding well, tolerating diet, ambulating well, pain well controlled, vital signs stable, incisions c/d/i and felt stable for discharge home. Discharged with oral Augmentin to complete a total of 5 days. Patient will follow up in our office in 2-4 weeks and knows to call with questions or concerns.     I have personally reviewed the patients medication history on the [place] controlled substance database.   Allergies as of 06/13/2022       Reactions   No Known Allergies         Medication List     TAKE these medications    acetaminophen 500 MG tablet Commonly known as: TYLENOL Take 500 mg by mouth every 6 (six) hours as needed for moderate pain.    amLODIPine-Valsartan-HCTZ 5-160-12.5 MG Tabs Take 1 tablet by mouth daily in the afternoon.   amoxicillin-clavulanate 875-125 MG tablet Commonly known as: AUGMENTIN Take 1 tablet by mouth 2 (two) times daily for 3 days.   traMADol 50 MG tablet Commonly known as: ULTRAM Take 1-2 tablets (50-100 mg total) by mouth every 6 (six) hours as needed for up to 7 days for moderate pain or severe pain.               Discharge Care Instructions  (From admission, onward)           Start     Ordered   06/11/22 0000  Discharge wound care:       Comments: It is good for closed incisions and even open wounds to be washed every day.  Shower every day.  Short baths are fine.  Wash the incisions and wounds clean with soap & water.    You may leave closed incisions open to air if it is dry.   You may cover the incision with clean gauze & replace it after your daily shower for comfort.  TEGADERM:  You have clear gauze band-aid dressings over your closed incision(s).  Remove the dressings 3 days after surgery.   06/11/22 1201              Follow-up Information     [facility] Follow up in 3 week(s).   Specialty: General Surgery Why: our office is scheduling you for post-operative follow up with our office PA, Puja Maczis, in 3 weeks. please call to confirm appointment date/time. Contact  information: [address] [place] [address] [phone]                Signed: Hosie Spangle, [facility] 06/13/2022, 3:10 PM

## 2022-06-13 NOTE — Progress Notes (Signed)
Discharge instructions given to patient and all questions were answered.  

## 2022-06-13 NOTE — Progress Notes (Signed)
2 Days Post-Op  Subjective: CC: Doing well. Reports his abdominal pain has improved. Some pain around incisions that are well controlled. Tramadol x 1 yesterday. Nausea resolved. No vomiting. Only had cld yesterday. Hasn't tried solid food again. BM x 1 yesterday. Voiding. Mobilized in halls x 2 yesterday.   Objective: Vital signs in last 24 hours: Temp:  [97.6 F (36.4 C)-98.4 F (36.9 C)] 98.1 F (36.7 C) (09/12 0607) Pulse Rate:  [66-71] 66 (09/12 0607) Resp:  [16-18] 18 (09/12 0607) BP: (123-137)/(69-85) 135/85 (09/12 0607) SpO2:  [96 %-97 %] 97 % (09/12 0607) Last BM Date : 06/12/22  Intake/Output from previous day: 09/11 0701 - 09/12 0700 In: 1893.7 [P.O.:240; I.V.:1653.7] Out: -  Intake/Output this shift: No intake/output data recorded.  PE: Gen:  Alert, NAD, pleasant Card:  RRR Pulm:  CTAB, no W/R/R, effort normal Abd: Soft, ND, appropriate tender around incisions. Otherwise NT. No rigidity or guarding. +BS, incisions with glue intact appears well and are without drainage, bleeding, or signs of infection Ext:  No LE edema Psych: A&Ox3  Skin: no rashes noted, warm and dry  Lab Results:  Recent Labs    06/10/22 2353 06/13/22 0441  WBC 18.6* 11.1*  HGB 15.3 11.4*  HCT 44.6 33.6*  PLT 283 242   BMET Recent Labs    06/10/22 2353 06/13/22 0441  NA 136 137  K 3.0* 3.5  CL 103 105  CO2 24 26  GLUCOSE 104* 140*  BUN 17 15  CREATININE 1.21 1.39*  CALCIUM 9.0 8.3*   PT/INR No results for input(s): "LABPROT", "INR" in the last 72 hours. CMP     Component Value Date/Time   NA 137 06/13/2022 0441   NA 140 01/18/2022 1101   K 3.5 06/13/2022 0441   CL 105 06/13/2022 0441   CO2 26 06/13/2022 0441   GLUCOSE 140 (H) 06/13/2022 0441   BUN 15 06/13/2022 0441   BUN 18 01/18/2022 1101   CREATININE 1.39 (H) 06/13/2022 0441   CALCIUM 8.3 (L) 06/13/2022 0441   PROT 7.9 06/10/2022 2353   ALBUMIN 4.5 06/10/2022 2353   AST 25 06/10/2022 2353   ALT 16  06/10/2022 2353   ALKPHOS 52 06/10/2022 2353   BILITOT 1.3 (H) 06/10/2022 2353   GFRNONAA >60 06/13/2022 0441   GFRAA 85 07/29/2020 1003   Lipase     Component Value Date/Time   LIPASE 32 06/10/2022 2353    Studies/Results: No results found.  Anti-infectives: Anti-infectives (From admission, onward)    Start     Dose/Rate Route Frequency Ordered Stop   06/12/22 1000  amoxicillin-clavulanate (AUGMENTIN) 875-125 MG per tablet 1 tablet        1 tablet Oral Every 12 hours 06/12/22 0845 06/16/22 0959   06/11/22 0330  cefTRIAXone (ROCEPHIN) 2 g in sodium chloride 0.9 % 100 mL IVPB  Status:  Discontinued       Note to Pharmacy: Pharmacy may adjust dosing strength / duration / interval for maximal efficacy   2 g 200 mL/hr over 30 Minutes Intravenous Every 24 hours 06/11/22 0310 06/12/22 0845   06/11/22 0315  metroNIDAZOLE (FLAGYL) IVPB 500 mg  Status:  Discontinued        500 mg 100 mL/hr over 60 Minutes Intravenous Every 12 hours 06/11/22 0310 06/12/22 0845   06/11/22 0300  piperacillin-tazobactam (ZOSYN) IVPB 3.375 g  Status:  Discontinued        3.375 g 100 mL/hr over 30 Minutes Intravenous  Once 06/11/22  2956 06/11/22 0310   06/11/22 0000  amoxicillin-clavulanate (AUGMENTIN) 875-125 MG tablet        1 tablet Oral 2 times daily 06/11/22 1201          Assessment/Plan POD 2 s/p Laparoscopic Appendectomy by Dr. Michaell Cowing on 06/11/22 for Acute Phlegmonous appendicitis - Ileus seems to be resolving. He has ROBF. Will ensure he tolerates re-advancement of diet prior to d/c - Cont postop abx as per Dr. Michaell Cowing whom has recommended 5 day course - on Augmentin - Mobilize, pulm toilet  FEN - Regular diet. IVF at 176ml/hr (would continue today given Cr up slightly at 1.39 from 1.21, his baseline appears to be ~1.24 - 1.3 based on labs over the last 1 year).  VTE - SCDs, Lovenox ID - Zosyn x 1 9/10. Rocephin/Flagyl 9/10. Augmentin 9/11 >> Afebrile. No tachycardia or hypotension. WBC down at  11.1 from 18.6 after transitioning to PO abx.   Hx HTN - home meds   LOS: 0 days    Jacinto Halim , [facility] 06/13/2022, 9:30 AM Please see Amion for pager number during day hours 7:00am-4:30pm

## 2022-07-14 ENCOUNTER — Ambulatory Visit (INDEPENDENT_AMBULATORY_CARE_PROVIDER_SITE_OTHER): Payer: 59 | Admitting: Internal Medicine

## 2022-07-14 VITALS — BP 144/98 | HR 76 | Temp 97.9°F | Wt 149.0 lb

## 2022-07-14 DIAGNOSIS — G8929 Other chronic pain: Secondary | ICD-10-CM | POA: Diagnosis not present

## 2022-07-14 DIAGNOSIS — M545 Low back pain, unspecified: Secondary | ICD-10-CM

## 2022-07-14 DIAGNOSIS — I10 Essential (primary) hypertension: Secondary | ICD-10-CM

## 2022-07-14 MED ORDER — AMLODIPINE-VALSARTAN-HCTZ 5-160-12.5 MG PO TABS: 1.0000 | ORAL_TABLET | Freq: Every day | ORAL | 1 refills | Status: DC

## 2022-07-14 NOTE — Progress Notes (Unsigned)
CC: back pain  HPI:  [name] is a 41 y.o. person with past medical history as detailed below who presents for back pain. Please see problem based charting for detailed assessment and plan.  Past Medical History:  Diagnosis Date   Atrophy of left kidney    History of acute renal failure    ADX 12-30-2014   History of kidney stones 12/2013   Hypertension    Incomplete right bundle branch block    Nephrolithiasis    BILATERAL --- PER CT  03-13-2014    Right ureteral stone    Review of Systems:  Negative unless otherwise stated.  Physical Exam:  Vitals:   07/14/22 0911 07/14/22 0926  BP: (!) 153/91 (!) 144/98  Pulse: 72 76  Temp: 97.9 F (36.6 C)   TempSrc: Oral   SpO2: 100%   Weight: 149 lb (67.6 kg)    Constitutional:Appears well, stated age. In no acute distress. Cardio:Regular rate and rhythm. No murmurs, rubs, or gallops. Pulm:Clear to auscultation bilaterally. Normal work of breathing on room air.: Abdomen:Soft, nontender, nondistended.: ZDG:LOVFIEPP for extremity edema. No tenderness to palpation over vertebral spinous processes or paraspinal musculature. Mild hypertonicity of R lumbar paraspinal muscles. Skin:Warm and dry. No rashes or lesions. Neuro:Alert and oriented x3. No focal deficit noted. Psych:Pleasant mood and affect.  Assessment & Plan:   See Encounters Tab for problem based charting.  Hypertension BP above goal, today, 153/91 with improvement to 144/98 on recheck. Current regimen is amlodipine-valsartan-HCTZ 5-160-12.5 mg daily. He has not taken this since his surgery because he was worried about polypharmacy.  Plan:Patient advised to restart previous antihypertensives. Patient educated on long term effects of uncontrolled hypertension on renal function. Given recent abnormal renal function on post-operative labs, will check a urine microalbumin/creatinine ratio today.  Chronic right-sided low back pain without sciatica [name] presents today  with the complaint of chronic R sided back pain. This pain originates in the lower lumbar spine with radiation in a dermatomal distribution to his anterior R torso. The pain has been present for some time and was present prior to recent appendectomy in 06/2022. The pain is burning in nature and comes and goes. He denies rash or itching in the area of pain. He has never had shingles. He denies fever, nausea, vomiting, change in urine color, difficulty with urination, difficulties with eating or drinking, falls or trauma, spine tenderness, or muscle pain. He does endorse occasional chills and difficulty with urination if he has a pain episode. The pain overall is unchanged from baseline. He has tried tylenol for his symptoms without much relief. 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 He does note that his pain is somewhat improved on days that he does not work.  Of note recent CT abdomen/pelvis noted chronic L5 pars defects with mild grade 1 L5-S1 spondylolisthesis without disc collapse and no acute osseous abnormality.  Assessment:I do not suspect recurrent nephrolithiasis or varicella as cause of his pain. I do not suspect ankylosing spondylitis at this time. I also do not suspect post-operative abscess as clinically the patient does not express signs or symptoms that would indicate any of these causes. I suspect given his occupation in a heavy-duty area that he may have some degenerative changes. I do not suspect his CT-noted L5-S1 spondylolisthesis is a major contributor though it certainly could be causing some discomfort. Plan:Patient counseled on conservative management including ibuprofen as needed for severe pain. If pain  persists or changes in quality, consider obtaining MRI for better evaluation of his pain.  Patient discussed with Dr. Evette Doffing

## 2022-07-14 NOTE — Patient Instructions (Signed)
[name],  It was a pleasure to care for you today!  I will let you know what the results of your urine sample show. Please make sure that you are taking your blood pressure medicine every day!   For your back pain, you can take ibuprofen as needed on days when the pain is more severe.  My best, Dr. Marlou Sa

## 2022-07-15 LAB — MICROALBUMIN / CREATININE URINE RATIO
Creatinine, Urine: 226.6 mg/dL
Microalb/Creat Ratio: 4 mg/g creat (ref 0–29)
Microalbumin, Urine: 8.2 ug/mL

## 2022-07-16 DIAGNOSIS — M545 Low back pain, unspecified: Secondary | ICD-10-CM | POA: Insufficient documentation

## 2022-07-16 NOTE — Assessment & Plan Note (Signed)
[name] presents today with the complaint of chronic R sided back pain. This pain originates in the lower lumbar spine with radiation in a dermatomal distribution to his anterior R torso. The pain has been present for some time and was present prior to recent appendectomy in 06/2022. The pain is burning in nature and comes and goes. He denies rash or itching in the area of pain. He has never had shingles. He denies fever, nausea, vomiting, change in urine color, difficulty with urination, difficulties with eating or drinking, falls or trauma, spine tenderness, or muscle pain. He does endorse occasional chills and difficulty with urination if he has a pain episode. The pain overall is unchanged from baseline. He has tried tylenol for his symptoms without much relief. 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 He does note that his pain is somewhat improved on days that he does not work.  Of note recent CT abdomen/pelvis noted chronic L5 pars defects with mild grade 1 L5-S1 spondylolisthesis without disc collapse and no acute osseous abnormality.  Assessment:I do not suspect recurrent nephrolithiasis or varicella as cause of his pain. I do not suspect ankylosing spondylitis at this time. I also do not suspect post-operative abscess as clinically the patient does not express signs or symptoms that would indicate any of these causes. I suspect given his occupation in a heavy-duty area that he may have some degenerative changes. I do not suspect his CT-noted L5-S1 spondylolisthesis is a major contributor though it certainly could be causing some discomfort. Plan:Patient counseled on conservative management including ibuprofen as needed for severe pain. If pain persists or changes in quality, consider obtaining MRI for better evaluation of his pain.

## 2022-07-16 NOTE — Assessment & Plan Note (Signed)
BP above goal, today, 153/91 with improvement to 144/98 on recheck. Current regimen is amlodipine-valsartan-HCTZ 5-160-12.5 mg daily. He has not taken this since his surgery because he was worried about polypharmacy.  Plan:Patient advised to restart previous antihypertensives. Patient educated on long term effects of uncontrolled hypertension on renal function. Given recent abnormal renal function on post-operative labs, will check a urine microalbumin/creatinine ratio today.

## 2022-07-17 ENCOUNTER — Encounter: Payer: Self-pay | Admitting: Surgery

## 2022-07-17 DIAGNOSIS — N183 Chronic kidney disease, stage 3 unspecified: Secondary | ICD-10-CM | POA: Insufficient documentation

## 2022-07-17 NOTE — Progress Notes (Signed)
Internal Medicine Clinic Attending ? ?Case discussed with Dr. Dean  At the time of the visit.  We reviewed the resident?s history and exam and pertinent patient test results.  I agree with the assessment, diagnosis, and plan of care documented in the resident?s note.  ?

## 2022-07-18 ENCOUNTER — Other Ambulatory Visit (HOSPITAL_COMMUNITY): Payer: Self-pay | Admitting: Surgery

## 2022-07-18 ENCOUNTER — Telehealth: Payer: Self-pay | Admitting: Internal Medicine

## 2022-07-18 DIAGNOSIS — N183 Chronic kidney disease, stage 3 unspecified: Secondary | ICD-10-CM

## 2022-07-18 DIAGNOSIS — G8918 Other acute postprocedural pain: Secondary | ICD-10-CM

## 2022-07-18 DIAGNOSIS — Z9049 Acquired absence of other specified parts of digestive tract: Secondary | ICD-10-CM

## 2022-07-18 DIAGNOSIS — K3589 Other acute appendicitis without perforation or gangrene: Secondary | ICD-10-CM

## 2022-07-18 DIAGNOSIS — R1084 Generalized abdominal pain: Secondary | ICD-10-CM

## 2022-07-18 NOTE — Telephone Encounter (Signed)
Patient contacted with the assistance of language interpreter, Ris[place], and informed of lab results from recent OV. There were no further questions.  Donnah Levert, DO 

## 2022-07-24 ENCOUNTER — Ambulatory Visit (HOSPITAL_COMMUNITY)
Admission: RE | Admit: 2022-07-24 | Discharge: 2022-07-24 | Disposition: A | Discharge status: Home / Self Care | Payer: 59 | Source: Ambulatory Visit | Attending: Surgery | Admitting: Surgery

## 2022-07-24 DIAGNOSIS — K3589 Other acute appendicitis without perforation or gangrene: Secondary | ICD-10-CM | POA: Insufficient documentation

## 2022-07-24 DIAGNOSIS — R1084 Generalized abdominal pain: Secondary | ICD-10-CM | POA: Diagnosis present

## 2022-07-24 DIAGNOSIS — G8918 Other acute postprocedural pain: Secondary | ICD-10-CM | POA: Diagnosis present

## 2022-07-24 DIAGNOSIS — N183 Chronic kidney disease, stage 3 unspecified: Secondary | ICD-10-CM | POA: Insufficient documentation

## 2022-07-24 DIAGNOSIS — Z9049 Acquired absence of other specified parts of digestive tract: Secondary | ICD-10-CM | POA: Insufficient documentation

## 2022-07-24 MED ORDER — IOHEXOL 350 MG/ML SOLN
75.0000 mL | Freq: Once | INTRAVENOUS | Status: AC | PRN
  Administered 2022-07-24: 75 mL via INTRAVENOUS

## 2022-07-27 ENCOUNTER — Other Ambulatory Visit: Payer: Self-pay | Admitting: Student

## 2022-07-27 DIAGNOSIS — N261 Atrophy of kidney (terminal): Secondary | ICD-10-CM

## 2022-07-27 DIAGNOSIS — N1330 Unspecified hydronephrosis: Secondary | ICD-10-CM

## 2022-07-27 NOTE — Progress Notes (Signed)
I was reached out by Dr. Johney Maine.  Patient had a repeat CT abdomen/pelvis for his persistent abdominal pain after appendectomy.  There was no evidence of abscess or obstruction or hernia.  Patient was advised to take Tylenol and heat.    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  We will place a referral to urology for further evaluation.

## 2023-02-26 ENCOUNTER — Encounter: Payer: Self-pay | Admitting: *Deleted

## 2023-04-06 ENCOUNTER — Other Ambulatory Visit: Payer: Self-pay | Admitting: Internal Medicine

## 2023-04-06 DIAGNOSIS — I10 Essential (primary) hypertension: Secondary | ICD-10-CM

## 2023-04-20 ENCOUNTER — Encounter: Payer: 59 | Admitting: Student

## 2023-04-20 NOTE — Progress Notes (Deleted)
HTN Amlodipine-valsartan- hydrochlorothiazide 5-160-12.5 mg  CKD stage 2a Creatinine 1.39   Hld Lab Results  Component Value Date   CHOL 214 (H) 01/18/2022   HDL 46 01/18/2022   LDLCALC 137 (H) 01/18/2022   TRIG 173 (H) 01/18/2022   CHOLHDL 4.7 01/18/2022   P: Lipid panel

## 2023-05-09 ENCOUNTER — Other Ambulatory Visit: Payer: Self-pay | Admitting: Student

## 2023-05-09 DIAGNOSIS — I10 Essential (primary) hypertension: Secondary | ICD-10-CM

## 2023-05-12 ENCOUNTER — Encounter (HOSPITAL_COMMUNITY): Payer: Self-pay

## 2023-05-12 ENCOUNTER — Emergency Department (HOSPITAL_COMMUNITY): Payer: Managed Care, Other (non HMO)

## 2023-05-12 ENCOUNTER — Other Ambulatory Visit: Payer: Self-pay

## 2023-05-12 ENCOUNTER — Emergency Department (HOSPITAL_COMMUNITY)
Admission: EM | Admit: 2023-05-12 | Discharge: 2023-05-12 | Disposition: A | Discharge status: Home / Self Care | Payer: Managed Care, Other (non HMO) | Attending: Student | Admitting: Student

## 2023-05-12 DIAGNOSIS — R1032 Left lower quadrant pain: Secondary | ICD-10-CM | POA: Diagnosis not present

## 2023-05-12 DIAGNOSIS — I129 Hypertensive chronic kidney disease with stage 1 through stage 4 chronic kidney disease, or unspecified chronic kidney disease: Secondary | ICD-10-CM | POA: Insufficient documentation

## 2023-05-12 DIAGNOSIS — N183 Chronic kidney disease, stage 3 unspecified: Secondary | ICD-10-CM | POA: Diagnosis not present

## 2023-05-12 LAB — CBC
HCT: 47.1 % (ref 39.0–52.0)
Hemoglobin: 15.7 g/dL (ref 13.0–17.0)
MCH: 24.6 pg — ABNORMAL LOW (ref 26.0–34.0)
MCHC: 33.3 g/dL (ref 30.0–36.0)
MCV: 73.8 fL — ABNORMAL LOW (ref 80.0–100.0)
Platelets: 348 10*3/uL (ref 150–400)
RBC: 6.38 MIL/uL — ABNORMAL HIGH (ref 4.22–5.81)
RDW: 12.8 % (ref 11.5–15.5)
WBC: 7.8 10*3/uL (ref 4.0–10.5)
nRBC: 0 % (ref 0.0–0.2)

## 2023-05-12 LAB — URINALYSIS, ROUTINE W REFLEX MICROSCOPIC
Bilirubin Urine: NEGATIVE
Glucose, UA: NEGATIVE mg/dL
Hgb urine dipstick: NEGATIVE
Ketones, ur: NEGATIVE mg/dL
Leukocytes,Ua: NEGATIVE
Nitrite: NEGATIVE
Protein, ur: NEGATIVE mg/dL
Specific Gravity, Urine: 1.016 (ref 1.005–1.030)
pH: 7 (ref 5.0–8.0)

## 2023-05-12 LAB — COMPREHENSIVE METABOLIC PANEL
ALT: 15 U/L (ref 0–44)
AST: 23 U/L (ref 15–41)
Albumin: 4.1 g/dL (ref 3.5–5.0)
Alkaline Phosphatase: 45 U/L (ref 38–126)
Anion gap: 10 (ref 5–15)
BUN: 15 mg/dL (ref 6–20)
CO2: 26 mmol/L (ref 22–32)
Calcium: 9.3 mg/dL (ref 8.9–10.3)
Chloride: 101 mmol/L (ref 98–111)
Creatinine, Ser: 1.27 mg/dL — ABNORMAL HIGH (ref 0.61–1.24)
GFR, Estimated: >60 mL/min (ref >60)
Glucose, Bld: 95 mg/dL (ref 70–99)
Potassium: 3.5 mmol/L (ref 3.5–5.1)
Sodium: 137 mmol/L (ref 135–145)
Total Bilirubin: 0.9 mg/dL (ref 0.3–1.2)
Total Protein: 7.8 g/dL (ref 6.5–8.1)

## 2023-05-12 LAB — LIPASE, BLOOD: Lipase: 34 U/L (ref 11–51)

## 2023-05-12 MED ORDER — IOHEXOL 300 MG/ML  SOLN
100.0000 mL | Freq: Once | INTRAMUSCULAR | Status: AC | PRN
  Administered 2023-05-12: 100 mL via INTRAVENOUS

## 2023-05-12 MED ORDER — ONDANSETRON HCL 4 MG/2ML IJ SOLN
4.0000 mg | Freq: Once | INTRAMUSCULAR | Status: AC
  Administered 2023-05-12: 4 mg via INTRAVENOUS
  Filled 2023-05-12: qty 2

## 2023-05-12 MED ORDER — DICYCLOMINE HCL 20 MG PO TABS: 20.0000 mg | ORAL_TABLET | Freq: Two times a day (BID) | ORAL | 0 refills | Status: AC

## 2023-05-12 MED ORDER — LACTATED RINGERS IV BOLUS
1000.0000 mL | Freq: Once | INTRAVENOUS | Status: AC
  Administered 2023-05-12: 1000 mL via INTRAVENOUS

## 2023-05-12 MED ORDER — SODIUM CHLORIDE (PF) 0.9 % IJ SOLN
INTRAMUSCULAR | Status: AC
  Filled 2023-05-12: qty 50

## 2023-05-12 MED ORDER — NAPROXEN 375 MG PO TABS: 375.0000 mg | ORAL_TABLET | Freq: Two times a day (BID) | ORAL | 0 refills | Status: AC

## 2023-05-12 MED ORDER — MORPHINE SULFATE (PF) 4 MG/ML IV SOLN
4.0000 mg | Freq: Once | INTRAVENOUS | Status: AC
  Administered 2023-05-12: 4 mg via INTRAVENOUS
  Filled 2023-05-12: qty 1

## 2023-05-12 MED ORDER — KETOROLAC TROMETHAMINE 15 MG/ML IJ SOLN
15.0000 mg | Freq: Once | INTRAMUSCULAR | Status: AC
  Administered 2023-05-12: 15 mg via INTRAVENOUS
  Filled 2023-05-12: qty 1

## 2023-05-12 NOTE — ED Provider Notes (Signed)
[place] EMERGENCY DEPARTMENT AT [facility] Provider Note  CSN: [id number] Arrival date & time: 05/12/23 1021  Chief Complaint(s) Abdominal Pain  HPI Jeremiah [name] is a 42 y.o. male with PMH nephrolithiasis, HTN, appendectomy who presents emergency room for evaluation of left lower quadrant pain, left flank pain and nausea.  Patient states that the pain has been present for the last 2 weeks and has not improved.  No associated chest pain, shortness of breath, headache, fever or other systemic symptoms.   Past Medical History Past Medical History:  Diagnosis Date   Atrophy of left kidney    History of acute renal failure    ADX 12-30-2014   History of kidney stones 12/2013   Hypertension    Incomplete right bundle branch block    Nephrolithiasis    BILATERAL --- PER CT  03-13-2014    Right ureteral stone    Patient Active Problem List   Diagnosis Date Noted   CKD (chronic kidney disease) stage 3, GFR 30-59 ml/min (HCC) 07/17/2022   Chronic right-sided low back pain without sciatica 07/16/2022   Atrophy of left kidney 06/11/2022   Incomplete right bundle branch block 06/11/2022   Hyperlipidemia 11/11/2020   Atypical chest pain 11/11/2020   Hypertension 07/08/2020   History of kidney stones 12/31/2013   Home Medication(s) Prior to Admission medications   Medication Sig Start Date End Date Taking? Authorizing Provider  acetaminophen (TYLENOL) 500 MG tablet Take 500 mg by mouth every 6 (six) hours as needed for moderate pain.    [provider]  amLODIPine-Valsartan-HCTZ 5-160-12.5 MG TABS TAKE 1 TABLET BY MOUTH DAILY IN THE AFTERNOON 05/10/23   Lovie Macadamia, MD                                                                                                                                    Past Surgical History Past Surgical History:  Procedure Laterality Date   CYSTO/  LEFT RETROGRADE PYELOGRAM/  LEFT URETERAL STENT PLACEMENT  10-17-2010   CYSTOSCOPY W/  URETERAL STENT PLACEMENT Right 12/30/2014   Procedure: CYSTOSCOPY WITH RIGHT RETROGRADE PYELOGRAM/ RIGHT URETERAL STENT PLACEMENT;  Surgeon: Sebastian Ache, MD;  Location: [facility];  Service: Urology;  Laterality: Right;   EXTRACORPOREAL SHOCK WAVE LITHOTRIPSY Right 02-02-2014   LAPAROSCOPIC APPENDECTOMY N/A 06/11/2022   Procedure: APPENDECTOMY LAPAROSCOPIC;  Surgeon: Karie Soda, MD;  Location: [facility];  Service: General;  Laterality: N/A;   NEPHROLITHOTOMY Left 11-21-2010   STONE EXTRACTION WITH BASKET Right 01/20/2015   Procedure: STONE EXTRACTION WITH BASKET;  Surgeon: Barron Alvine, MD;  Location: [facility];  Service: Urology;  Laterality: Right;   Family History No family history on file.  Social History Social History   Tobacco Use   Smoking status: Never   Smokeless tobacco: Never  Substance Use Topics   Alcohol use: No   Drug use: No   Allergies No known allergies  Review of Systems Review of Systems  Gastrointestinal:  Positive for abdominal pain and nausea.  Genitourinary:  Positive for flank pain.    Physical Exam Vital Signs  I have reviewed the triage vital signs BP (!) 160/91 (BP Location: Left Arm)   Pulse 73   Temp 98.2 F (36.8 C) (Oral)   Resp 16   Ht 5\' 3"  (1.6 m)   Wt 67 kg   SpO2 100%   BMI 26.17 kg/m   Physical Exam Constitutional:      General: He is not in acute distress.    Appearance: Normal appearance.  HENT:     Head: Normocephalic and atraumatic.     Nose: No congestion or rhinorrhea.  Eyes:     General:        Right eye: No discharge.        Left eye: No discharge.     Extraocular Movements: Extraocular movements intact.     Pupils: Pupils are equal, round, and reactive to light.  Cardiovascular:     Rate and Rhythm: Normal rate and regular rhythm.     Heart sounds: No murmur heard. Pulmonary:     Effort: No respiratory distress.     Breath sounds: No wheezing or rales.  Abdominal:     General: There is no  distension.     Tenderness: There is abdominal tenderness in the left lower quadrant.  Musculoskeletal:        General: Normal range of motion.     Cervical back: Normal range of motion.  Skin:    General: Skin is warm and dry.  Neurological:     General: No focal deficit present.     Mental Status: He is alert.     ED Results and Treatments Labs (all labs ordered are listed, but only abnormal results are displayed) Labs Reviewed  COMPREHENSIVE METABOLIC PANEL - Abnormal; Notable for the following components:      Result Value   Creatinine, Ser 1.27 (*)    All other components within normal limits  CBC - Abnormal; Notable for the following components:   RBC 6.38 (*)    MCV 73.8 (*)    MCH 24.6 (*)    All other components within normal limits  LIPASE, BLOOD  URINALYSIS, ROUTINE W REFLEX MICROSCOPIC                                                                                                                          Radiology No results found.  Pertinent labs & imaging results that were available during my care of the patient were reviewed by me and considered in my medical decision making (see MDM for details).  Medications Ordered in ED Medications - No data to display  Procedures Procedures  (including critical care time)  Medical Decision Making / ED Course   This patient presents to the ED for concern of abdominal pain, this involves an extensive number of treatment options, and is a complaint that carries with it a high risk of complications and morbidity.  The differential diagnosis includes diverticulitis, epiploic appendagitis, colitis, gastroenteritis, constipation, nephrolithiasis, inflammatory bowel disease  MDM: Patient seen emerged part for evaluation of abdominal pain and nausea.  Physical exam with mild left lower quadrant  tenderness to palpation but is otherwise unremarkable.  Laboratory evaluation with a creatinine of 1.27 but is otherwise unremarkable.  No send leukocytosis.  Urinalysis unremarkable.  CT abdomen pelvis is also unremarkable and shows no acute intra-abdominal pathology.  Patient symptoms improved with pain control and fluid resuscitation in the emergency room.  At this time with overall negative workup patient does not meet inpatient criteria for admission and he is safe for discharge with outpatient follow-up.  I placed a referral to outpatient gastroenterology but he was given return precautions of which he voiced understanding.  Patient then discharged   Additional history obtained: -Additional history obtained from son -External records from outside source obtained and reviewed including: Chart review including previous notes, labs, imaging, consultation notes   Lab Tests: -I ordered, reviewed, and interpreted labs.   The pertinent results include:   Labs Reviewed  COMPREHENSIVE METABOLIC PANEL - Abnormal; Notable for the following components:      Result Value   Creatinine, Ser 1.27 (*)    All other components within normal limits  CBC - Abnormal; Notable for the following components:   RBC 6.38 (*)    MCV 73.8 (*)    MCH 24.6 (*)    All other components within normal limits  LIPASE, BLOOD  URINALYSIS, ROUTINE W REFLEX MICROSCOPIC        Imaging Studies ordered: I ordered imaging studies including CTAP I independently visualized and interpreted imaging. I agree with the radiologist interpretation   Medicines ordered and prescription drug management: No orders of the defined types were placed in this encounter.   -I have reviewed the patients home medicines and have made adjustments as needed  Critical interventions none   Cardiac Monitoring: The patient was maintained on a cardiac monitor.  I personally viewed and interpreted the cardiac monitored which showed an  underlying rhythm of: NSR  Social Determinants of Health:  Factors impacting patients care include: [place] speaking   Reevaluation: After the interventions noted above, I reevaluated the patient and found that they have :improved  Co morbidities that complicate the patient evaluation  Past Medical History:  Diagnosis Date   Atrophy of left kidney    History of acute renal failure    ADX 12-30-2014   History of kidney stones 12/2013   Hypertension    Incomplete right bundle branch block    Nephrolithiasis    BILATERAL --- PER CT  03-13-2014    Right ureteral stone       Dispostion: I considered admission for this patient, but at this time he does not meet inpatient criteria for admission he is safe for discharge with outpatient follow-up     Final Clinical Impression(s) / ED Diagnoses Final diagnoses:  None     @PCDICTATION @    Glendora Score, MD 05/12/23 1429

## 2023-05-12 NOTE — ED Triage Notes (Signed)
P arrived via POV. C/o LLQ abd pain, and pain in R flank for 2x weeks. Pt is constant.  AOx4

## 2023-07-04 ENCOUNTER — Other Ambulatory Visit: Payer: Self-pay | Admitting: Student

## 2023-07-04 DIAGNOSIS — I10 Essential (primary) hypertension: Secondary | ICD-10-CM

## 2023-07-27 ENCOUNTER — Other Ambulatory Visit: Payer: Self-pay

## 2023-07-27 ENCOUNTER — Ambulatory Visit (INDEPENDENT_AMBULATORY_CARE_PROVIDER_SITE_OTHER): Payer: Managed Care, Other (non HMO) | Admitting: Internal Medicine

## 2023-07-27 ENCOUNTER — Encounter: Payer: Self-pay | Admitting: Internal Medicine

## 2023-07-27 VITALS — BP 129/78 | HR 70 | Temp 97.8°F | Ht 63.0 in | Wt 157.8 lb

## 2023-07-27 DIAGNOSIS — G8929 Other chronic pain: Secondary | ICD-10-CM | POA: Diagnosis not present

## 2023-07-27 DIAGNOSIS — M5441 Lumbago with sciatica, right side: Secondary | ICD-10-CM | POA: Diagnosis not present

## 2023-07-27 DIAGNOSIS — I10 Essential (primary) hypertension: Secondary | ICD-10-CM | POA: Diagnosis not present

## 2023-07-27 MED ORDER — GABAPENTIN 100 MG PO CAPS
100.0000 mg | ORAL_CAPSULE | Freq: Three times a day (TID) | ORAL | 0 refills | Status: AC
Start: 2023-07-27 — End: 2023-08-26

## 2023-07-27 NOTE — Patient Instructions (Addendum)
Your blood pressure looked great today. Continue taking combination pill.  2. You have something called sciatica. I am referring you to physical therapy to get education on some exercises to help this. Please follow-up in 1 month. If pain has not improved then we may get some imaging and refer you to an office that can offer steroid injections to help with pain.

## 2023-07-28 DIAGNOSIS — G8929 Other chronic pain: Secondary | ICD-10-CM | POA: Insufficient documentation

## 2023-07-28 NOTE — Progress Notes (Signed)
Subjective:  CC: blood pressure check and low back pain  HPI:  [name] is a 42 y.o. male with a past medical history stated below and presents today for blood pressure and back pain with right leg pain that started several months ago.   Please see problem based assessment and plan for additional details. Interpreter present during visit.  Past Medical History:  Diagnosis Date   Atrophy of left kidney    History of acute renal failure    ADX 12-30-2014   History of kidney stones 12/2013   Hypertension    Incomplete right bundle branch block    Nephrolithiasis    BILATERAL --- PER CT  03-13-2014    Right ureteral stone     Current Outpatient Medications on File Prior to Visit  Medication Sig Dispense Refill   acetaminophen (TYLENOL) 500 MG tablet Take 500 mg by mouth every 6 (six) hours as needed for moderate pain.     amLODIPine-Valsartan-HCTZ 5-160-12.5 MG TABS TAKE 1 TABLET BY MOUTH DAILY IN THE AFTERNOON 30 tablet 1   dicyclomine (BENTYL) 20 MG tablet Take 1 tablet (20 mg total) by mouth 2 (two) times daily. 20 tablet 0   naproxen (NAPROSYN) 375 MG tablet Take 1 tablet (375 mg total) by mouth 2 (two) times daily. 20 tablet 0   No current facility-administered medications on file prior to visit.    No family history on file.  Social History   Socioeconomic History   Marital status: Married    Spouse name: Not on file   Number of children: Not on file   Years of education: Not on file   Highest education level: Not on file  Occupational History   Not on file  Tobacco Use   Smoking status: Never   Smokeless tobacco: Never  Substance and Sexual Activity   Alcohol use: No   Drug use: No   Sexual activity: Not on file  Other Topics Concern   Not on file  Social History Narrative   [place]    Social Determinants of Health   Financial Resource Strain: Not on file  Food Insecurity: Not on file  Transportation Needs: Not on file  Physical  Activity: Not on file  Stress: Not on file  Social Connections: Not on file  Intimate Partner Violence: Not on file    Review of Systems: ROS negative except for what is noted on the assessment and plan.  Objective:   Vitals:   07/27/23 0933 07/27/23 0942  BP: 131/84 129/78  Pulse: 70 70  Temp: 97.8 F (36.6 C)   TempSrc: Oral   SpO2: 100%   Weight: 157 lb 12.8 oz (71.6 kg)   Height: 5\' 3"  (1.6 m)     Physical Exam: Constitutional: well-appearing Cardiovascular: regular rate and rhythm, no m/r/g Pulmonary/Chest: normal work of breathing on room air, lungs clear to auscultation bilaterally MSK:  No TTP over vertebrae of L1-S, SLR + on right, hip flexion 5/5 bilaterally, knee F/E 5/5 bilaterally Neurological: normal gait, sensation intact to lower extremity bilaterally  Assessment & Plan:  Hypertension Blood pressure well controlled at 129/78. Home medication includes amlodipine-valsartan-hydrochlorothiazide 5-160-12.5 mg. He is tolerating medications well. Last BMP in 8/24 with stable renal function and electrolytes. P: Continue amlodipine-valsartan-hydrochlorothiazide 5-160-12.5 mg -repeat BMP in 2/25  Chronic midline low back pain with right-sided sciatica He presents with several month history of low back pain with right sided pain that radiates into his lower leg. He  did not have a specific injury. He has noted that pain in lower extremity worsens with more activity. Pain in back is dull and pain in leg is sharp, shooting pain. Denies fevers, weight loss, and no personal history of cancer. Straight leg raise test positive on right with pain shooting into right foot from back. Strength is 5/5 to hip and knees bilaterally, sensation intact. CT from 2023 with chronic L5 pars defects with mild grade 1 L5-S1 spondylolisthesis without disc collapse.  A: Chronic lumbosacral radiculopathy- differentials with disc herniation versus worsening spondylolisthesis P: Trial with  conservative management Referral to PT Start gabapentin 100 mg TID Follow-up in 4 weeks   Patient discussed with Dr. Wille Celeste Marckus Hanover, D.O. [facility] Internal Medicine  PGY-3 Pager: [phone]  Phone: [phone] Date 07/28/2023  Time 8:34 PM

## 2023-07-28 NOTE — Assessment & Plan Note (Addendum)
He presents with several month history of low back pain with right sided pain that radiates into his lower leg. He did not have a specific injury. He has noted that pain in lower extremity worsens with more activity. Pain in back is dull and pain in leg is sharp, shooting pain. Denies fevers, weight loss, and no personal history of cancer. Straight leg raise test positive on right with pain shooting into right foot from back. Strength is 5/5 to hip and knees bilaterally, sensation intact. CT from 2023 with chronic L5 pars defects with mild grade 1 L5-S1 spondylolisthesis without disc collapse.  A: Chronic lumbosacral radiculopathy- differentials with disc herniation versus worsening spondylolisthesis P: Trial with conservative management Referral to PT Start gabapentin 100 mg TID Follow-up in 4 weeks

## 2023-07-28 NOTE — Assessment & Plan Note (Signed)
Blood pressure well controlled at 129/78. Home medication includes amlodipine-valsartan-hydrochlorothiazide 5-160-12.5 mg. He is tolerating medications well. Last BMP in 8/24 with stable renal function and electrolytes. P: Continue amlodipine-valsartan-hydrochlorothiazide 5-160-12.5 mg -repeat BMP in 2/25

## 2023-07-30 NOTE — Progress Notes (Signed)
Internal Medicine Clinic Attending  Case discussed with the resident at the time of the visit.  We reviewed the resident's history and exam and pertinent patient test results.  I agree with the assessment, diagnosis, and plan of care documented in the resident's note.  

## 2023-08-20 NOTE — Therapy (Addendum)
OUTPATIENT PHYSICAL THERAPY THORACOLUMBAR EVALUATION   Patient Name: Jeremiah [name] MRN: [id number] DOB:[birth year], 42 y.o., male Today's Date: 08/22/2023  END OF SESSION:  PT End of Session - 08/21/23 1006     Visit Number 1    Number of Visits 13    Date for PT Re-Evaluation 10/12/23    Authorization Type [employer] MANAGED    PT Start Time 0845    PT Stop Time 0930    PT Time Calculation (min) 45 min    Activity Tolerance Patient tolerated treatment well    Behavior During Therapy [facility] for tasks assessed/performed             Past Medical History:  Diagnosis Date   Atrophy of left kidney    History of acute renal failure    ADX 12-30-2014   History of kidney stones 12/2013   Hypertension    Incomplete right bundle branch block    Nephrolithiasis    BILATERAL --- PER CT  03-13-2014    Right ureteral stone    Past Surgical History:  Procedure Laterality Date   CYSTO/  LEFT RETROGRADE PYELOGRAM/  LEFT URETERAL STENT PLACEMENT  10-17-2010   CYSTOSCOPY W/ URETERAL STENT PLACEMENT Right 12/30/2014   Procedure: CYSTOSCOPY WITH RIGHT RETROGRADE PYELOGRAM/ RIGHT URETERAL STENT PLACEMENT;  Surgeon: Sebastian Ache, MD;  Location: [facility];  Service: Urology;  Laterality: Right;   EXTRACORPOREAL SHOCK WAVE LITHOTRIPSY Right 02-02-2014   LAPAROSCOPIC APPENDECTOMY N/A 06/11/2022   Procedure: APPENDECTOMY LAPAROSCOPIC;  Surgeon: Karie Soda, MD;  Location: [facility];  Service: General;  Laterality: N/A;   NEPHROLITHOTOMY Left 11-21-2010   STONE EXTRACTION WITH BASKET Right 01/20/2015   Procedure: STONE EXTRACTION WITH BASKET;  Surgeon: Barron Alvine, MD;  Location: [facility];  Service: Urology;  Laterality: Right;   Patient Active Problem List   Diagnosis Date Noted   Chronic midline low back pain with right-sided sciatica 07/28/2023   CKD (chronic kidney disease) stage 3, GFR 30-59 ml/min (HCC) 07/17/2022   Chronic right-sided low back pain without sciatica 07/16/2022    Atrophy of left kidney 06/11/2022   Incomplete right bundle branch block 06/11/2022   Hyperlipidemia 11/11/2020   Atypical chest pain 11/11/2020   Hypertension 07/08/2020   History of kidney stones 12/31/2013    PCP: Lovie Macadamia, MD  REFERRING PROVIDER: Reymundo Poll, MD  REFERRING DIAG: [phone] (ICD-10-CM) - Chronic right-sided low back pain with right-sided sciatica   Rationale for Evaluation and Treatment: Rehabilitation  THERAPY DIAG:  Chronic left-sided low back pain with left-sided sciatica - Plan: PT plan of care cert/re-cert  Difficulty in walking, not elsewhere classified - Plan: PT plan of care cert/re-cert  ONSET DATE: 2-3 months  SUBJECTIVE:  SUBJECTIVE STATEMENT: Pt reports having a low amount of R LBP that radiates laterally down the R LE to his foot. The R LE pain is worse than the low back. Pt denies numbness of the R LE. Pt notes no particular incident caused the increase in pain, but the pain did develop 6-8 months after having surgery on his appendix.   PERTINENT HISTORY:  See PMH  PAIN:  Are you having pain? Yes: NPRS scale: 4/10. Pain range prior to start of PT: 3-7/10. Pain location: R LBP that radiates laterally down the R LE to his foo Pain description: Bitting Aggravating factors: Initially with walking, physical activity Relieving factors: Rest-sitting  PRECAUTIONS: None  RED FLAGS: None   WEIGHT BEARING RESTRICTIONS: No  FALLS:  Has patient fallen in last 6 months? No  LIVING ENVIRONMENT: Lives with: lives with their family Lives in: House/apartment Able to access and be mobile within home  OCCUPATION: Manufacturing mattresses  PLOF: Independent  PATIENT GOALS: Less pain  OBJECTIVE:  Note: Objective measures were completed at  Evaluation unless otherwise noted.  DIAGNOSTIC FINDINGS:  None available in Epic  PATIENT SURVEYS:  FOTO: Perceived function   44%, predicted   53%   SCREENING FOR RED FLAGS: Bowel or bladder incontinence: No Spinal tumors: No Cauda equina syndrome: No   COGNITION: Overall cognitive status: Within functional limits for tasks assessed     SENSATION: WFL  MUSCLE LENGTH: Hamstrings: Right 45 deg with pain; Left 45 deg Thomas test: Right NT deg; Left NT deg  POSTURE: rounded shoulders and forward head  PALPATION: TTP of the R lateral thigh and calf  LUMBAR ROM:   AROM eval  Flexion Full, increase in R LE pain  Extension Full, min increase in R LE pain  Right lateral flexion Full, increase in R LE pain  Left lateral flexion Full, min increase in R LE pain  Right rotation Full, min increase in R LE pain  Left rotation Full, min increase in R LE pain   (Blank rows = not tested)  LOWER EXTREMITY ROM:     Grossly WNLs without significant provocation of R LE pain Active  Right eval Left eval  Hip flexion    Hip extension    Hip abduction    Hip adduction    Hip internal rotation    Hip external rotation    Knee flexion    Knee extension    Ankle dorsiflexion    Ankle plantarflexion    Ankle inversion    Ankle eversion     (Blank rows = not tested)  LOWER EXTREMITY MMT:    Myotome screen negative MMT Right eval Left eval  Hip flexion    Hip extension    Hip abduction    Hip adduction    Hip internal rotation    Hip external rotation    Knee flexion    Knee extension    Ankle dorsiflexion    Ankle plantarflexion    Ankle inversion    Ankle eversion     (Blank rows = not tested)  LUMBAR SPECIAL TESTS:  Straight leg raise test: Positive, Slump test: Negative, SI Compression/distraction test: Negative, and FABER test: Negative  FUNCTIONAL TESTS:  NT  GAIT: Distance walked: 274ft Assistive device utilized: None Level of assistance: Complete  Independence Comments: Full, min increase in R LE pain  TODAY'S TREATMENT:  [facility] Adult PT Treatment:                                                DATE: 08/21/23 Eval only  PATIENT EDUCATION:  Education details: Eval findings, POC  Person educated: Patient Education method: Explanation Education comprehension: verbalized understanding  HOME EXERCISE PROGRAM: To be provided  ASSESSMENT:  CLINICAL IMPRESSION: Patient is a 42 y.o. male who was seen today for physical therapy evaluation and treatment for M54.41,G89.29 (ICD-10-CM) - Chronic right-sided low back pain with right-sided sciatica.  Pt presents with radicular R LE pain without numbness. R SLR was positive, while the R slump test did not provoke the R LE pain further. With trunk movements, flexion and R SB provoked the pain the greatest degree. Prone extension for directional preference, did not alter the pt's pain. Pt will benefit from skilled PT 1-2x per 6 weeks to address impairments to optimize trunk and LE function with less pain.   OBJECTIVE IMPAIRMENTS: decreased activity tolerance, difficulty walking, and pain.   ACTIVITY LIMITATIONS: carrying, lifting, bending, standing, and locomotion level  PARTICIPATION LIMITATIONS: meal prep, cleaning, laundry, occupation, and yard work  PERSONAL FACTORS: Past/current experiences and Time since onset of injury/illness/exacerbation are also affecting patient's functional outcome.   REHAB POTENTIAL: Good  CLINICAL DECISION MAKING: Evolving/moderate complexity  EVALUATION COMPLEXITY: Moderate   GOALS: LONG TERM GOALS: Target date: 09/14/23  Pt will be Ind in an initial HEP  Baseline: Goal status: INITIAL  2.  Pt will voice understanding of measures to assist in pain reduction  Baseline:  Goal status: INITIAL  LONG TERM GOALS: Target date:  10/12/23  Pt will be Ind in a final HEP to maintain achieved LOF Baseline:  Goal status: INITIAL  2.  Pt will report 50% or greater decrease in his R LE pain for improved function and QOL Baseline:  Goal status: INITIAL  3.  Pt will demonstrate proper body mechanics for to decrease pain and back strain associated with work Baseline:  Goal status: INITIAL  4.  Pt will report improved tolerance for work related activities from quite a bit of difficulty to a litle bit of difficulty Baseline: quite a bit of difficulty Goal status: INITIAL  5.  Pt's FOTO score will improved to the predicted value of 53% as indication of improved function  Baseline: 44% Goal status: INITIAL  PLAN:  PT FREQUENCY: 1-2x/week  PT DURATION: 6 weeks  PLANNED INTERVENTIONS: 97164- PT Re-evaluation, 97110-Therapeutic exercises, 97530- Therapeutic activity, 97535- Self Care, 16109- Manual therapy, [id number]- Gait training, 97014- Electrical stimulation (unattended), [id number]- Electrical stimulation (manual), [id number]- Ultrasound, Patient/Family education, Taping, Dry Needling, Joint mobilization, Spinal mobilization, Cryotherapy, and Moist heat.  PLAN FOR NEXT SESSION: Review FOTO; assess response to HEP; progress therex as indicated; use of modalities, manual therapy; and TPDN as indicated.    PHYSICAL THERAPY DISCHARGE SUMMARY  Visits from Start of Care: 1  Current functional level related to goals / functional outcomes: Unable to assess   Remaining deficits: Unable to assess   Education / Equipment: None   Patient agrees to discharge. Patient goals were not met. Patient is being discharged due to not returning since the last visit.   Dellia Nims Diy PT, DPT 08/22/23 5:58 AM

## 2023-08-21 ENCOUNTER — Ambulatory Visit: Payer: Managed Care, Other (non HMO) | Attending: Internal Medicine

## 2023-08-21 ENCOUNTER — Other Ambulatory Visit: Payer: Self-pay

## 2023-08-21 DIAGNOSIS — M5442 Lumbago with sciatica, left side: Secondary | ICD-10-CM | POA: Diagnosis present

## 2023-08-21 DIAGNOSIS — R262 Difficulty in walking, not elsewhere classified: Secondary | ICD-10-CM | POA: Insufficient documentation

## 2023-08-21 DIAGNOSIS — M5441 Lumbago with sciatica, right side: Secondary | ICD-10-CM | POA: Insufficient documentation

## 2023-08-21 DIAGNOSIS — G8929 Other chronic pain: Secondary | ICD-10-CM | POA: Insufficient documentation

## 2023-08-24 ENCOUNTER — Encounter: Payer: Self-pay | Admitting: Student

## 2023-08-24 ENCOUNTER — Ambulatory Visit (INDEPENDENT_AMBULATORY_CARE_PROVIDER_SITE_OTHER): Payer: Managed Care, Other (non HMO) | Admitting: Student

## 2023-08-24 VITALS — BP 153/88 | HR 71 | Temp 98.0°F | Ht 63.0 in | Wt 159.7 lb

## 2023-08-24 DIAGNOSIS — I10 Essential (primary) hypertension: Secondary | ICD-10-CM | POA: Diagnosis not present

## 2023-08-24 DIAGNOSIS — M79661 Pain in right lower leg: Secondary | ICD-10-CM | POA: Diagnosis not present

## 2023-08-24 DIAGNOSIS — M79662 Pain in left lower leg: Secondary | ICD-10-CM

## 2023-08-24 NOTE — Patient Instructions (Addendum)
C?m ?n ng Gus Height ? cho php chng ti ch?m [place] cho ng ngy hm nay. Hm nay chng ta th?o lu?n v? s?c kh?e chung v c?n ?au b?p chn c?a b?n.  Chng ti ? lm cc xt nghi?m t?i phng khm ?? ??m b?o r?ng khng c t?c ngh?n no trong ??ng m?ch ? chn c?a b?n v n ? tr? l?i bnh th??ng. Ti ngh? ?y l c?n ?au c? nhi?u h?n v ti khuyn b?n nn dng Tylenol ?? gi?m ?au v dng mi?ng ??m s??i ?m n?u c?n. Vui lng th? cch ny trong kho?ng 2 tu?n v cho chng ti bi?t n?u b?n c b?t k? m?i lo ng?i no khc.     Thank you, Mr.Yonis Wieck for allowing [place] to provide your care today. Today we discussed your general health and your calf pain.  We did tests in the office to make sure there was no blocks in the arteries in your leg and it came back normal. I think this is more of a muscle ache and would recommend Tylenol for the pain and heating pads as needed. Please try this for about 2 weeks and let us know if you have any other concerns.  Please go to [facility] and ask for   HEATING PAD FOR CALVES    I have ordered the following labs for you:  Lab Orders  No laboratory test(s) ordered today     Tests ordered today:    Referrals ordered today:   Referral Orders  No referral(s) requested today     I have ordered the following medication/changed the following medications:   Stop the following medications: There are no discontinued medications.   Start the following medications: No orders of the defined types were placed in this encounter.    Follow up: 4 months   Remember:   Should you have any questions or concerns please call the internal medicine clinic at [phone].    Kathleen Lime, M.D [facility]

## 2023-08-24 NOTE — Progress Notes (Signed)
CC: Bilateral lower extremity calf pain  HPI:  [name] is a 42 y.o. male living with a history stated below and presents today for his 4-week follow-up on his chronic right-sided lower back pain which seems to have resolved.  Patient not report bilateral lower extremity calf pain that is worse on the right, says it is achy nonradiating and rated as 5 out of 10 at this time.  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 Please see problem based assessment and plan for additional details.  Past Medical History:  Diagnosis Date   Atrophy of left kidney    History of acute renal failure    ADX 12-30-2014   History of kidney stones 12/2013   Hypertension    Incomplete right bundle branch block    Nephrolithiasis    BILATERAL --- PER CT  03-13-2014    Right ureteral stone     Current Outpatient Medications on File Prior to Visit  Medication Sig Dispense Refill   acetaminophen (TYLENOL) 500 MG tablet Take 500 mg by mouth every 6 (six) hours as needed for moderate pain.     amLODIPine-Valsartan-HCTZ 5-160-12.5 MG TABS TAKE 1 TABLET BY MOUTH DAILY IN THE AFTERNOON 30 tablet 1   dicyclomine (BENTYL) 20 MG tablet Take 1 tablet (20 mg total) by mouth 2 (two) times daily. 20 tablet 0   gabapentin (NEURONTIN) 100 MG capsule Take 1 capsule (100 mg total) by mouth 3 (three) times daily. 90 capsule 0   naproxen (NAPROSYN) 375 MG tablet Take 1 tablet (375 mg total) by mouth 2 (two) times daily. 20 tablet 0   No current facility-administered medications on file prior to visit.    No family history on file.  Social History   Socioeconomic History   Marital status: Married    Spouse name: Not on file   Number of children: Not on file   Years of education: Not on file   Highest education level: Not on file  Occupational History   Not on file  Tobacco Use   Smoking status: Never   Smokeless  tobacco: Never  Substance and Sexual Activity   Alcohol use: No   Drug use: No   Sexual activity: Not on file  Other Topics Concern   Not on file  Social History Narrative   [place]    Social Determinants of Health   Financial Resource Strain: Not on file  Food Insecurity: Not on file  Transportation Needs: Not on file  Physical Activity: Not on file  Stress: Not on file  Social Connections: Not on file  Intimate Partner Violence: Not on file    Review of Systems: ROS negative except for what is noted on the assessment and plan.  Vitals:   08/24/23 0938  BP: (!) 153/88  Pulse: 71  Temp: 98 F (36.7 C)  TempSrc: Oral  SpO2: 100%  Weight: 159 lb 11.2 oz (72.4 kg)  Height: 5\' 3"  (1.6 m)    Physical Exam: Constitutional: well-appearing man sitting in chair , in no acute distress,interpretor in the room  HENT: normocephalic atraumatic, mucous membranes moist Eyes: conjunctiva non-erythematous Cardiovascular: regular rate and rhythm, no m/r/g Pulmonary/Chest: normal work of breathing on room air, lungs clear to auscultation bilaterally Abdominal: soft, non-tender, non-distended MSK: normal bulk and tone Neurological: alert & oriented x 3, no focal deficit Skin: warm and dry Psych:  normal mood and behavior  Assessment & Plan:   No problem-specific Assessment & Plan notes found for this encounter.  "[place] [employer] interpretor used in this encounter "  #Bilateral lower extremity calf pain Presents with concerns for bilateral lower extremities calve pain that is worse on the right. 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 He denies any recent long travels or immobilization, both calves are fairly same in size,  didn't appreciate any warmth or erythema on exam,denies any shortness of breath and the fact that  his pain is in both legs makes me less suspicions of DVT at this time. Patient's history of hypertension and dyslipidemia made me worry about a potential PAD.  His ABI's however were completely normal. Left and Right ABI are 1.23 and 1.26 respectively. I suspect this is more of a musculoskeletal pain that gets worse when he strains his calves at work. Will treat consecutively at this time and reassess patient in 2 weeks. - Weight bearing as tolerated  - Tylenol for pain  - Heating pads as needed  #Hypertension Hx of hypertension on Amlodipine-Valsartan-HCTZ 5-160-12.5 MG. BP in the office elevated at 153/88. Pt has been in the 120-130s during his last visits. This would be white coat.He denies any chest pain, shortness of breath or headaches at this time.  - Continue same regimen - Home BP measuring   Patient seen with Dr. Heloise Beecham, M.D [facility] Phone: [phone] Date 08/24/2023 Time 3:24 PM

## 2023-08-27 NOTE — Progress Notes (Signed)
Internal Medicine Clinic Attending  I was physically present during the key portions of the resident provided service and participated in the medical decision making of patient's management care. I reviewed pertinent patient test results.  The assessment, diagnosis, and plan were formulated together and I agree with the documentation in the resident's note.  Mercie Eon, MD   Symptoms were worrisome for PAD, but ABI's were normal in clinic, so I don't think this is consistent with PAD and agree with plan for conservative care.

## 2023-08-29 NOTE — Addendum Note (Signed)
Addended by: Bufford Spikes on: 08/29/2023 12:27 PM   Modules accepted: Orders

## 2023-09-01 ENCOUNTER — Telehealth: Payer: Self-pay

## 2023-09-01 ENCOUNTER — Ambulatory Visit: Payer: Managed Care, Other (non HMO)

## 2023-09-01 NOTE — Telephone Encounter (Signed)
PT called patient via interpreter but was unable to leave voicemail. This is his first no show.   Jeremiah [name]   09/01/23 12:06 PM

## 2023-09-03 NOTE — Addendum Note (Signed)
Addended by: Kathleen Lime on: 09/03/2023 08:49 AM   Modules accepted: Orders

## 2023-09-05 NOTE — Therapy (Unsigned)
OUTPATIENT PHYSICAL THERAPY THORACOLUMBAR EVALUATION   Patient Name: Jeremiah [name] MRN: [id number] DOB:1980-12-05, 42 y.o., male Today's Date: 09/05/2023  END OF SESSION:    Past Medical History:  Diagnosis Date   Atrophy of left kidney    History of acute renal failure    ADX 12-30-2014   History of kidney stones 12/2013   Hypertension    Incomplete right bundle branch block    Nephrolithiasis    BILATERAL --- PER CT  03-13-2014    Right ureteral stone    Past Surgical History:  Procedure Laterality Date   CYSTO/  LEFT RETROGRADE PYELOGRAM/  LEFT URETERAL STENT PLACEMENT  10-17-2010   CYSTOSCOPY W/ URETERAL STENT PLACEMENT Right 12/30/2014   Procedure: CYSTOSCOPY WITH RIGHT RETROGRADE PYELOGRAM/ RIGHT URETERAL STENT PLACEMENT;  Surgeon: Sebastian Ache, MD;  Location: [facility];  Service: Urology;  Laterality: Right;   EXTRACORPOREAL SHOCK WAVE LITHOTRIPSY Right 02-02-2014   LAPAROSCOPIC APPENDECTOMY N/A 06/11/2022   Procedure: APPENDECTOMY LAPAROSCOPIC;  Surgeon: Karie Soda, MD;  Location: [facility];  Service: General;  Laterality: N/A;   NEPHROLITHOTOMY Left 11-21-2010   STONE EXTRACTION WITH BASKET Right 01/20/2015   Procedure: STONE EXTRACTION WITH BASKET;  Surgeon: Barron Alvine, MD;  Location: [facility];  Service: Urology;  Laterality: Right;   Patient Active Problem List   Diagnosis Date Noted   Bilateral calf pain 08/24/2023   Chronic midline low back pain with right-sided sciatica 07/28/2023   CKD (chronic kidney disease) stage 3, GFR 30-59 ml/min (HCC) 07/17/2022   Chronic right-sided low back pain without sciatica 07/16/2022   Atrophy of left kidney 06/11/2022   Incomplete right bundle branch block 06/11/2022   Hyperlipidemia 11/11/2020   Atypical chest pain 11/11/2020   Hypertension 07/08/2020   History of kidney stones 12/31/2013    PCP: Lovie Macadamia, MD  REFERRING PROVIDER: Reymundo Poll, MD  REFERRING DIAG: [id number] (ICD-10-CM) -  Chronic right-sided low back pain with right-sided sciatica   Rationale for Evaluation and Treatment: Rehabilitation  THERAPY DIAG:  No diagnosis found.  ONSET DATE: 2-3 months  SUBJECTIVE:                                                                                                                                                                                           SUBJECTIVE STATEMENT: ***  PERTINENT HISTORY:  See PMH  PAIN:  Are you having pain? Yes: NPRS scale: 4/10. Pain range prior to start of PT: 3-7/10. Pain location: R LBP that radiates laterally down the R LE to his foo Pain description: Bitting Aggravating factors: Initially with walking, physical activity Relieving factors: Rest-sitting  PRECAUTIONS:  None  RED FLAGS: None   WEIGHT BEARING RESTRICTIONS: No  FALLS:  Has patient fallen in last 6 months? No  LIVING ENVIRONMENT: Lives with: lives with their family Lives in: House/apartment Able to access and be mobile within home  OCCUPATION: Manufacturing mattresses  PLOF: Independent  PATIENT GOALS: Less pain  OBJECTIVE:  Note: Objective measures were completed at Evaluation unless otherwise noted.  DIAGNOSTIC FINDINGS:  None available in Epic  PATIENT SURVEYS:  FOTO: Perceived function   44%, predicted   53%   SCREENING FOR RED FLAGS: Bowel or bladder incontinence: No Spinal tumors: No Cauda equina syndrome: No   COGNITION: Overall cognitive status: Within functional limits for tasks assessed     SENSATION: WFL  MUSCLE LENGTH: Hamstrings: Right 45 deg with pain; Left 45 deg Thomas test: Right NT deg; Left NT deg  POSTURE: rounded shoulders and forward head  PALPATION: TTP of the R lateral thigh and calf  LUMBAR ROM:   AROM eval  Flexion Full, increase in R LE pain  Extension Full, min increase in R LE pain  Right lateral flexion Full, increase in R LE pain  Left lateral flexion Full, min increase in R LE pain  Right  rotation Full, min increase in R LE pain  Left rotation Full, min increase in R LE pain   (Blank rows = not tested)  LOWER EXTREMITY ROM:     Grossly WNLs without significant provocation of R LE pain Active  Right eval Left eval  Hip flexion    Hip extension    Hip abduction    Hip adduction    Hip internal rotation    Hip external rotation    Knee flexion    Knee extension    Ankle dorsiflexion    Ankle plantarflexion    Ankle inversion    Ankle eversion     (Blank rows = not tested)  LOWER EXTREMITY MMT:    Myotome screen negative MMT Right eval Left eval  Hip flexion    Hip extension    Hip abduction    Hip adduction    Hip internal rotation    Hip external rotation    Knee flexion    Knee extension    Ankle dorsiflexion    Ankle plantarflexion    Ankle inversion    Ankle eversion     (Blank rows = not tested)  LUMBAR SPECIAL TESTS:  Straight leg raise test: Positive, Slump test: Negative, SI Compression/distraction test: Negative, and FABER test: Negative  FUNCTIONAL TESTS:  NT  GAIT: Distance walked: 277ft Assistive device utilized: None Level of assistance: Complete Independence Comments: Full, min increase in R LE pain  TODAY'S TREATMENT:                                                                                                                              [facility] Adult PT Treatment:  DATE: 09/01/2023 Therapeutic Exercise: *** Manual Therapy: ***  [facility] Adult PT Treatment:                                                DATE: 08/21/23 Eval only  PATIENT EDUCATION:  Education details: Eval findings, POC  Person educated: Patient Education method: Explanation Education comprehension: verbalized understanding  HOME EXERCISE PROGRAM: To be provided  ASSESSMENT:  CLINICAL IMPRESSION: ***  EVAL: Patient is a 42 y.o. male who was seen today for physical therapy evaluation and treatment for  M54.41,G89.29 (ICD-10-CM) - Chronic right-sided low back pain with right-sided sciatica.  Pt presents with radicular R LE pain without numbness. R SLR was positive, while the R slump test did not provoke the R LE pain further. With trunk movements, flexion and R SB provoked the pain the greatest degree. Prone extension for directional preference, did not alter the pt's pain. Pt will benefit from skilled PT 1-2x per 6 weeks to address impairments to optimize trunk and LE function with less pain.   OBJECTIVE IMPAIRMENTS: decreased activity tolerance, difficulty walking, and pain.   ACTIVITY LIMITATIONS: carrying, lifting, bending, standing, and locomotion level  PARTICIPATION LIMITATIONS: meal prep, cleaning, laundry, occupation, and yard work  PERSONAL FACTORS: Past/current experiences and Time since onset of injury/illness/exacerbation are also affecting patient's functional outcome.   REHAB POTENTIAL: Good  CLINICAL DECISION MAKING: Evolving/moderate complexity  EVALUATION COMPLEXITY: Moderate   GOALS: LONG TERM GOALS: Target date: 09/14/23  Pt will be Ind in an initial HEP  Baseline: Goal status: INITIAL  2.  Pt will voice understanding of measures to assist in pain reduction  Baseline:  Goal status: INITIAL  LONG TERM GOALS: Target date: 10/12/23  Pt will be Ind in a final HEP to maintain achieved LOF Baseline:  Goal status: INITIAL  2.  Pt will report 50% or greater decrease in his R LE pain for improved function and QOL Baseline:  Goal status: INITIAL  3.  Pt will demonstrate proper body mechanics for to decrease pain and back strain associated with work Baseline:  Goal status: INITIAL  4.  Pt will report improved tolerance for work related activities from quite a bit of difficulty to a litle bit of difficulty Baseline: quite a bit of difficulty Goal status: INITIAL  5.  Pt's FOTO score will improved to the predicted value of 53% as indication of improved function   Baseline: 44% Goal status: INITIAL  PLAN:  PT FREQUENCY: 1-2x/week  PT DURATION: 6 weeks  PLANNED INTERVENTIONS: 97164- PT Re-evaluation, 97110-Therapeutic exercises, 97530- Therapeutic activity, 97535- Self Care, 16109- Manual therapy, [id number]- Gait training, 97014- Electrical stimulation (unattended), [id number]- Electrical stimulation (manual), [id number]- Ultrasound, Patient/Family education, Taping, Dry Needling, Joint mobilization, Spinal mobilization, Cryotherapy, and Moist heat.  PLAN FOR NEXT SESSION: Review FOTO; assess response to HEP; progress therex as indicated; use of modalities, manual therapy; and TPDN as indicated.    Lise Auer Lockie Bothun PT  09/05/23 2:17 PM

## 2023-09-06 ENCOUNTER — Ambulatory Visit: Payer: Managed Care, Other (non HMO) | Attending: Internal Medicine | Admitting: Physical Therapy

## 2023-09-06 ENCOUNTER — Telehealth: Payer: Self-pay | Admitting: Physical Therapy

## 2023-09-06 ENCOUNTER — Encounter: Payer: Self-pay | Admitting: Physical Therapy

## 2023-09-06 NOTE — Telephone Encounter (Signed)
Pt missed his 2nd appt today.  Interpreter not available to call patient.  Has an appt this weekend but the rest of his appts were cancelled due to no show x 2.  Will follow up when interpreter available.  Karie Mainland, PT 09/06/23 1:06 PM Phone: [phone] Fax: [phone]

## 2023-09-08 ENCOUNTER — Ambulatory Visit: Payer: Managed Care, Other (non HMO)

## 2023-09-11 ENCOUNTER — Encounter: Payer: Managed Care, Other (non HMO) | Admitting: Physical Therapy

## 2023-09-18 ENCOUNTER — Encounter: Payer: Managed Care, Other (non HMO) | Admitting: Physical Therapy

## 2023-09-25 ENCOUNTER — Encounter: Payer: Managed Care, Other (non HMO) | Admitting: Physical Therapy

## 2023-10-02 ENCOUNTER — Encounter: Payer: Managed Care, Other (non HMO) | Admitting: Physical Therapy

## 2023-10-09 ENCOUNTER — Encounter: Payer: Managed Care, Other (non HMO) | Admitting: Physical Therapy

## 2024-03-17 ENCOUNTER — Encounter: Payer: Self-pay | Admitting: *Deleted
